# Patient Record
Sex: Female | Born: 1980 | Race: Black or African American | Hispanic: No | Marital: Single | Smoking: Former smoker
Health system: Southern US, Community
[De-identification: ages and names within clinical notes are randomized; demographics above are authoritative.]

---

## 2008-05-25 ENCOUNTER — Emergency Department (HOSPITAL_BASED_OUTPATIENT_CLINIC_OR_DEPARTMENT_OTHER): Admission: EM | Admit: 2008-05-25 | Discharge: 2008-05-25 | Payer: Self-pay | Admitting: Emergency Medicine

## 2009-01-05 ENCOUNTER — Emergency Department (HOSPITAL_COMMUNITY): Admission: EM | Admit: 2009-01-05 | Discharge: 2009-01-05 | Payer: Self-pay | Admitting: Emergency Medicine

## 2009-01-27 ENCOUNTER — Emergency Department (HOSPITAL_COMMUNITY): Admission: EM | Admit: 2009-01-27 | Discharge: 2009-01-28 | Payer: Self-pay | Admitting: Emergency Medicine

## 2009-04-25 ENCOUNTER — Emergency Department (HOSPITAL_COMMUNITY): Admission: EM | Admit: 2009-04-25 | Discharge: 2009-04-25 | Payer: Self-pay | Admitting: Emergency Medicine

## 2009-10-21 ENCOUNTER — Emergency Department (HOSPITAL_COMMUNITY): Admission: EM | Admit: 2009-10-21 | Discharge: 2009-10-21 | Payer: Self-pay | Admitting: Emergency Medicine

## 2010-03-17 ENCOUNTER — Emergency Department (HOSPITAL_BASED_OUTPATIENT_CLINIC_OR_DEPARTMENT_OTHER): Admission: EM | Admit: 2010-03-17 | Discharge: 2010-03-17 | Payer: Self-pay | Admitting: Emergency Medicine

## 2010-04-12 ENCOUNTER — Ambulatory Visit: Payer: Self-pay | Admitting: Diagnostic Radiology

## 2010-05-23 ENCOUNTER — Emergency Department (HOSPITAL_COMMUNITY): Admission: EM | Admit: 2010-05-23 | Discharge: 2010-05-23 | Payer: Self-pay | Admitting: Emergency Medicine

## 2010-06-09 ENCOUNTER — Emergency Department (HOSPITAL_BASED_OUTPATIENT_CLINIC_OR_DEPARTMENT_OTHER): Admission: EM | Admit: 2010-06-09 | Discharge: 2010-04-12 | Payer: Self-pay | Admitting: Emergency Medicine

## 2010-06-09 ENCOUNTER — Emergency Department (HOSPITAL_COMMUNITY): Admission: EM | Admit: 2010-06-09 | Discharge: 2010-01-08 | Payer: Self-pay | Admitting: Emergency Medicine

## 2010-09-15 LAB — D-DIMER, QUANTITATIVE: D-Dimer, Quant: 0.37 ug/mL-FEU (ref 0.00–0.48)

## 2010-09-18 LAB — POCT I-STAT, CHEM 8
BUN: 16 mg/dL (ref 6–23)
Calcium, Ion: 1 mmol/L — ABNORMAL LOW (ref 1.12–1.32)
Chloride: 114 mEq/L — ABNORMAL HIGH (ref 96–112)
Creatinine, Ser: 0.8 mg/dL (ref 0.4–1.2)
Glucose, Bld: 96 mg/dL (ref 70–99)
HCT: 37 % (ref 36.0–46.0)
Hemoglobin: 12.6 g/dL (ref 12.0–15.0)
Potassium: 4.2 mEq/L (ref 3.5–5.1)
Sodium: 140 mEq/L (ref 135–145)
TCO2: 19 mmol/L (ref 0–100)

## 2010-09-18 LAB — POCT PREGNANCY, URINE: Preg Test, Ur: NEGATIVE

## 2010-10-09 LAB — URINALYSIS, ROUTINE W REFLEX MICROSCOPIC
Bilirubin Urine: NEGATIVE
Glucose, UA: NEGATIVE mg/dL
Hgb urine dipstick: NEGATIVE
Ketones, ur: NEGATIVE mg/dL
Nitrite: NEGATIVE
Protein, ur: NEGATIVE mg/dL
Specific Gravity, Urine: 1.031 — ABNORMAL HIGH (ref 1.005–1.030)
Urobilinogen, UA: 0.2 mg/dL (ref 0.0–1.0)
pH: 6 (ref 5.0–8.0)

## 2010-10-09 LAB — POCT I-STAT, CHEM 8
BUN: 6 mg/dL (ref 6–23)
Calcium, Ion: 1.18 mmol/L (ref 1.12–1.32)
Chloride: 109 mEq/L (ref 96–112)
Creatinine, Ser: 0.8 mg/dL (ref 0.4–1.2)
Glucose, Bld: 72 mg/dL (ref 70–99)
HCT: 37 % (ref 36.0–46.0)
Hemoglobin: 12.6 g/dL (ref 12.0–15.0)
Potassium: 3.7 mEq/L (ref 3.5–5.1)
Sodium: 142 mEq/L (ref 135–145)
TCO2: 21 mmol/L (ref 0–100)

## 2010-10-09 LAB — PREGNANCY, URINE: Preg Test, Ur: NEGATIVE

## 2010-10-09 LAB — POCT CARDIAC MARKERS
CKMB, poc: <1 ng/mL — ABNORMAL LOW (ref 1.0–8.0)
Myoglobin, poc: 26.8 ng/mL (ref 12–200)
Troponin i, poc: <0.05 ng/mL (ref 0.00–0.09)

## 2010-10-09 LAB — D-DIMER, QUANTITATIVE: D-Dimer, Quant: 0.28 ug/mL-FEU (ref 0.00–0.48)

## 2010-10-11 ENCOUNTER — Emergency Department (INDEPENDENT_AMBULATORY_CARE_PROVIDER_SITE_OTHER): Payer: Self-pay

## 2010-10-11 ENCOUNTER — Emergency Department (HOSPITAL_BASED_OUTPATIENT_CLINIC_OR_DEPARTMENT_OTHER)
Admission: EM | Admit: 2010-10-11 | Discharge: 2010-10-12 | Disposition: A | Discharge status: Home / Self Care | Payer: Self-pay | Attending: Emergency Medicine | Admitting: Emergency Medicine

## 2010-10-11 DIAGNOSIS — R079 Chest pain, unspecified: Secondary | ICD-10-CM | POA: Insufficient documentation

## 2010-10-11 DIAGNOSIS — R071 Chest pain on breathing: Secondary | ICD-10-CM | POA: Insufficient documentation

## 2010-10-11 DIAGNOSIS — F17200 Nicotine dependence, unspecified, uncomplicated: Secondary | ICD-10-CM

## 2010-10-12 LAB — D-DIMER, QUANTITATIVE: D-Dimer, Quant: 0.3 ug/mL-FEU (ref 0.00–0.48)

## 2011-12-06 ENCOUNTER — Encounter (HOSPITAL_COMMUNITY): Payer: Self-pay

## 2011-12-06 ENCOUNTER — Emergency Department (HOSPITAL_COMMUNITY)
Admission: EM | Admit: 2011-12-06 | Discharge: 2011-12-06 | Disposition: A | Discharge status: Home / Self Care | Payer: No Typology Code available for payment source | Attending: Emergency Medicine | Admitting: Emergency Medicine

## 2011-12-06 DIAGNOSIS — M25551 Pain in right hip: Secondary | ICD-10-CM

## 2011-12-06 DIAGNOSIS — M545 Low back pain, unspecified: Secondary | ICD-10-CM | POA: Insufficient documentation

## 2011-12-06 DIAGNOSIS — M25559 Pain in unspecified hip: Secondary | ICD-10-CM | POA: Insufficient documentation

## 2011-12-06 DIAGNOSIS — F17200 Nicotine dependence, unspecified, uncomplicated: Secondary | ICD-10-CM | POA: Insufficient documentation

## 2011-12-06 MED ORDER — DIAZEPAM 5 MG PO TABS: 5.0000 mg | ORAL_TABLET | Freq: Three times a day (TID) | ORAL | Status: AC | PRN

## 2011-12-06 MED ORDER — HYDROCODONE-ACETAMINOPHEN 5-325 MG PO TABS: 1.0000 | ORAL_TABLET | ORAL | Status: AC | PRN

## 2011-12-06 MED ORDER — HYDROCODONE-ACETAMINOPHEN 5-325 MG PO TABS
1.0000 | ORAL_TABLET | Freq: Once | ORAL | Status: AC
  Administered 2011-12-06: 1 via ORAL
  Filled 2011-12-06: qty 1

## 2011-12-06 NOTE — ED Provider Notes (Signed)
Medical screening examination/treatment/procedure(s) were performed by non-physician practitioner and as supervising physician I was immediately available for consultation/collaboration.  Eddith Mentor L Alston Berrie, MD 12/06/11 2332 
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
## 2012-01-14 ENCOUNTER — Emergency Department (HOSPITAL_BASED_OUTPATIENT_CLINIC_OR_DEPARTMENT_OTHER): Payer: Self-pay

## 2012-01-14 ENCOUNTER — Encounter (HOSPITAL_BASED_OUTPATIENT_CLINIC_OR_DEPARTMENT_OTHER): Payer: Self-pay | Admitting: *Deleted

## 2012-01-14 ENCOUNTER — Emergency Department (HOSPITAL_BASED_OUTPATIENT_CLINIC_OR_DEPARTMENT_OTHER)
Admission: EM | Admit: 2012-01-14 | Discharge: 2012-01-14 | Disposition: A | Discharge status: Home / Self Care | Payer: Self-pay | Attending: Emergency Medicine | Admitting: Emergency Medicine

## 2012-01-14 DIAGNOSIS — R071 Chest pain on breathing: Secondary | ICD-10-CM | POA: Insufficient documentation

## 2012-01-14 DIAGNOSIS — F17200 Nicotine dependence, unspecified, uncomplicated: Secondary | ICD-10-CM | POA: Insufficient documentation

## 2012-01-14 DIAGNOSIS — R0789 Other chest pain: Secondary | ICD-10-CM

## 2012-01-14 LAB — URINALYSIS, ROUTINE W REFLEX MICROSCOPIC
Bilirubin Urine: NEGATIVE
Ketones, ur: 40 mg/dL — AB
Nitrite: NEGATIVE
Urobilinogen, UA: 1 mg/dL (ref 0.0–1.0)
pH: 5.5 (ref 5.0–8.0)

## 2012-01-14 LAB — BASIC METABOLIC PANEL
Calcium: 9.8 mg/dL (ref 8.4–10.5)
GFR calc Af Amer: >90 mL/min (ref >90)
GFR calc non Af Amer: 85 mL/min — ABNORMAL LOW (ref >90)
Sodium: 139 mEq/L (ref 135–145)

## 2012-01-14 LAB — CBC
MCH: 29.5 pg (ref 26.0–34.0)
MCHC: 34.4 g/dL (ref 30.0–36.0)
Platelets: 220 10*3/uL (ref 150–400)
RDW: 11.5 % (ref 11.5–15.5)

## 2012-01-14 LAB — TROPONIN I: Troponin I: <0.3 ng/mL (ref <0.30)

## 2012-01-14 LAB — PREGNANCY, URINE: Preg Test, Ur: NEGATIVE

## 2012-01-14 MED ORDER — HYDROCODONE-ACETAMINOPHEN 5-325 MG PO TABS: 2.0000 | ORAL_TABLET | ORAL | Status: AC | PRN

## 2012-01-14 MED ORDER — IBUPROFEN 800 MG PO TABS: 800.0000 mg | ORAL_TABLET | Freq: Three times a day (TID) | ORAL | Status: AC

## 2012-01-14 NOTE — ED Notes (Signed)
Pt presents to ED today with left sided chest pain that pt states is non-radiating in nature.  Pt reports pain at worst is 10/10 and has SHOB and "sweats" with pain

## 2012-01-14 NOTE — ED Provider Notes (Signed)
Medical screening examination/treatment/procedure(s) were performed by non-physician practitioner and as supervising physician I was immediately available for consultation/collaboration.  Rochelle Nephew, MD 01/14/12 2336 

## 2012-01-14 NOTE — ED Provider Notes (Signed)
History     CSN: [id number]  Arrival date & time 01/14/12  1147   First MD Initiated Contact with Patient 01/14/12 1214      Chief Complaint  Patient presents with  . Chest Pain    (Consider location/radiation/quality/duration/timing/severity/associated sxs/prior treatment) Patient is a 31 y.o. female presenting with chest pain. The history is provided by the patient. No language interpreter was used.  Chest Pain The chest pain began 1 - 2 weeks ago. Duration of episode(s) is 2 weeks. Chest pain occurs constantly. The chest pain is unchanged. The pain is associated with breathing. At its most intense, the pain is at 10/10. The pain is currently at 6/10. The quality of the pain is described as stabbing and sharp. The pain does not radiate. Chest pain is worsened by certain positions and deep breathing. She tried nothing for the symptoms. Risk factors include no known risk factors.  Pertinent negatives for past medical history include no CAD, no diabetes and no hypertension. Procedure history comments: none.   Pt started on depo injection 1 week ago.   Pt reports she had pain before depo/  History reviewed. No pertinent past medical history.  History reviewed. No pertinent past surgical history.  Family History  Problem Relation Age of Onset  . Asthma Mother   . Rheum arthritis Mother   . Hypertension Mother   . Heart failure Mother     History  Substance Use Topics  . Smoking status: Current Everyday Smoker  . Smokeless tobacco: Never Used  . Alcohol Use: No    OB History    Grav Para Term Preterm Abortions TAB SAB Ect Mult Living                  Review of Systems  Cardiovascular: Positive for chest pain.  All other systems reviewed and are negative.    Allergies  Review of patient's allergies indicates no known allergies.  Home Medications   Current Outpatient Rx  Name Route Sig Dispense Refill  . OMEGA-3 FATTY ACIDS 1000 MG PO CAPS Oral Take 2 g by mouth  daily.    . IBUPROFEN 200 MG PO TABS Oral Take 200 mg by mouth every 6 (six) hours as needed. Pain    . MEDROXYPROGESTERONE ACETATE 150 MG/ML IM SUSP Intramuscular Inject 150 mg into the muscle every 3 (three) months.    . ADULT MULTIVITAMIN W/MINERALS CH Oral Take 1 tablet by mouth daily.      BP 142/95  Pulse 70  Temp 98 F (36.7 C) (Oral)  Ht 5\' 7"  (1.702 m)  Wt 156 lb (70.761 kg)  BMI 24.43 kg/m2  SpO2 100%  Physical Exam  Nursing note and vitals reviewed. Constitutional: She appears well-developed and well-nourished.  HENT:  Head: Normocephalic and atraumatic.  Eyes: Conjunctivae and EOM are normal. Pupils are equal, round, and reactive to light.  Neck: Normal range of motion.  Cardiovascular: Normal rate and regular rhythm.   Pulmonary/Chest: Effort normal and breath sounds normal.  Abdominal: Soft.  Musculoskeletal: She exhibits tenderness.       Tender left chest,    Neurological: She is alert.  Skin: Skin is warm.    ED Course  Procedures (including critical care time)  Labs Reviewed  BASIC METABOLIC PANEL - Abnormal; Notable for the following:    GFR calc non Af Amer 85 (*)     All other components within normal limits  URINALYSIS, ROUTINE W REFLEX MICROSCOPIC - Abnormal; Notable for the  following:    Color, Urine AMBER (*)  BIOCHEMICALS MAY BE AFFECTED BY COLOR   APPearance CLOUDY (*)     Ketones, ur 40 (*)     All other components within normal limits  CBC  TROPONIN I  PREGNANCY, URINE  LIPASE, BLOOD  D-DIMER, QUANTITATIVE   Dg Chest 2 View  01/14/2012  *RADIOLOGY REPORT*  Clinical Data: Pleuritic chest pain.  CHEST - 2 VIEW  Comparison:  10/12/2010  Findings:  The heart size and mediastinal contours are within normal limits.  Both lungs are clear.  The visualized skeletal structures are unremarkable.  IMPRESSION: No active cardiopulmonary disease.  Original Report Authenticated By: Danae Orleans, M.D.     Date: 01/14/2012  Rate: 68  Rhythm: normal  sinus rhythm  QRS Axis: normal  Intervals: normal  ST/T Wave abnormalities: normal  Conduction Disutrbances:none  Narrative Interpretation:   Old EKG Reviewed: unchanged  No diagnosis found.    MDM  Labs ddimer negative,  Pt's pain seems more muscular/pleuritic.   Pt given rx for hydrocodone and ibuprofen        Lonia Skinner [place], [place] 01/14/12 1418

## 2012-09-09 ENCOUNTER — Encounter (HOSPITAL_BASED_OUTPATIENT_CLINIC_OR_DEPARTMENT_OTHER): Payer: Self-pay

## 2012-09-09 ENCOUNTER — Emergency Department (HOSPITAL_BASED_OUTPATIENT_CLINIC_OR_DEPARTMENT_OTHER): Payer: Self-pay

## 2012-09-09 DIAGNOSIS — F17200 Nicotine dependence, unspecified, uncomplicated: Secondary | ICD-10-CM | POA: Insufficient documentation

## 2012-09-09 DIAGNOSIS — IMO0002 Reserved for concepts with insufficient information to code with codable children: Secondary | ICD-10-CM | POA: Insufficient documentation

## 2012-09-09 DIAGNOSIS — Z79899 Other long term (current) drug therapy: Secondary | ICD-10-CM | POA: Insufficient documentation

## 2012-09-09 DIAGNOSIS — R0982 Postnasal drip: Secondary | ICD-10-CM | POA: Insufficient documentation

## 2012-09-09 DIAGNOSIS — R490 Dysphonia: Secondary | ICD-10-CM | POA: Insufficient documentation

## 2012-09-09 DIAGNOSIS — J3489 Other specified disorders of nose and nasal sinuses: Secondary | ICD-10-CM | POA: Insufficient documentation

## 2012-09-09 LAB — PREGNANCY, URINE: Preg Test, Ur: NEGATIVE

## 2012-09-09 NOTE — ED Provider Notes (Signed)
History  This chart was scribed for April Smitty Cords, MD by Ardeen Jourdain, ED Scribe. This patient was seen in room MH10/MH10 and the patient's care was started at 0015.  CSN: [id number]  Arrival date & time 09/09/12  2146   None     Chief Complaint  Patient presents with  . Cough     Patient is a 32 y.o. female presenting with cough. The history is provided by the patient. No language interpreter was used.  Cough Cough characteristics:  Non-productive Severity:  Mild Onset quality:  Gradual Duration:  3 days Timing:  Intermittent Progression:  Worsening Chronicity:  New Smoker: no   Context: not sick contacts, not weather changes and not with activity   Relieved by:  Nothing Worsened by:  Nothing tried Ineffective treatments:  Cough suppressants and decongestant Associated symptoms: sinus congestion   Associated symptoms: no chest pain, no chills, no diaphoresis, no fever, no headaches, no rash, no rhinorrhea, no shortness of breath and no sore throat   Risk factors: no recent infection and no recent travel     Rose Lowe is a 32 y.o. female who presents to the Emergency Department complaining of cough and hoarse voice that began 3 days ago. She reports using OTC cold medication with no relief.    History reviewed. No pertinent past medical history.  History reviewed. No pertinent past surgical history.  Family History  Problem Relation Age of Onset  . Asthma Mother   . Rheum arthritis Mother   . Hypertension Mother   . Heart failure Mother     History  Substance Use Topics  . Smoking status: Current Every Day Smoker  . Smokeless tobacco: Never Used  . Alcohol Use: No   No OB history available.   Review of Systems  Constitutional: Negative for fever, chills and diaphoresis.  HENT: Negative for sore throat, rhinorrhea, neck pain and neck stiffness.   Respiratory: Positive for cough. Negative for shortness of breath.   Cardiovascular: Negative  for chest pain, palpitations and leg swelling.  Skin: Negative for rash.  Neurological: Negative for headaches.  All other systems reviewed and are negative.    Allergies  Review of patient's allergies indicates no known allergies.  Home Medications   Current Outpatient Rx  Name  Route  Sig  Dispense  Refill  . fish oil-omega-3 fatty acids 1000 MG capsule   Oral   Take 2 g by mouth daily.         . fluticasone (FLONASE) 50 MCG/ACT nasal spray   Nasal   Place 2 sprays into the nose daily.   16 g   0   . ibuprofen (ADVIL,MOTRIN) 200 MG tablet   Oral   Take 200 mg by mouth every 6 (six) hours as needed. Pain         . medroxyPROGESTERone (DEPO-PROVERA) 150 MG/ML injection   Intramuscular   Inject 150 mg into the muscle every 3 (three) months.         . Multiple Vitamin (MULITIVITAMIN WITH MINERALS) TABS   Oral   Take 1 tablet by mouth daily.           Triage Vitals: BP 127/82  Pulse 77  Temp(Src) 98.9 F (37.2 C) (Oral)  Resp 16  Ht 5\' 7"  (1.702 m)  Wt 145 lb (65.772 kg)  BMI 22.71 kg/m2  SpO2 96%  Physical Exam  Nursing note and vitals reviewed. Constitutional: She is oriented to person, place, and time. She appears  well-developed and well-nourished. No distress.  HENT:  Head: Normocephalic and atraumatic.  Mouth/Throat: Oropharynx is clear and moist. No oropharyngeal exudate.  Clear colorless drainage down the throat, TMs normal bilaterally   Eyes: EOM are normal. Pupils are equal, round, and reactive to light.  Neck: Normal range of motion. Neck supple. No tracheal deviation present.  No lymphadenopathy   Cardiovascular: Normal rate, regular rhythm and normal heart sounds.  Exam reveals no gallop and no friction rub.   No murmur heard. Pulmonary/Chest: Effort normal and breath sounds normal. No respiratory distress. She has no wheezes. She has no rales. She exhibits no tenderness.  Abdominal: Soft. Bowel sounds are normal. She exhibits no  distension and no mass. There is no tenderness. There is no rebound and no guarding.  Musculoskeletal: Normal range of motion. She exhibits no edema.  Lymphadenopathy:    She has no cervical adenopathy.  Neurological: She is alert and oriented to person, place, and time.  Skin: Skin is warm and dry.  Psychiatric: She has a normal mood and affect. Her behavior is normal.    ED Course  Procedures (including critical care time)  DIAGNOSTIC STUDIES: Oxygen Saturation is 96% on room air, normal by my interpretation.    COORDINATION OF CARE:  12:25 AM: Discussed treatment plan which includes nasal spray with pt at bedside and pt agreed to plan.     Labs Reviewed  PREGNANCY, URINE   Dg Chest 2 View  09/10/2012  *RADIOLOGY REPORT*  Clinical Data: Cough, congestion, sore throat  CHEST - 2 VIEW  Comparison: 01/14/2012  Findings: Normal heart size, mediastinal contours, and pulmonary vascularity. Lungs clear. No pleural effusion or pneumothorax. Bones unremarkable.  IMPRESSION: No acute abnormalities.   Original Report Authenticated By: Ulyses Southward, M.D.      1. Post-nasal drip       MDM  Symptoms consistent with post nasal drip.  Will treat for same     I personally performed the services described in this documentation, which was scribed in my presence. The recorded information has been reviewed and is accurate.    Jasmine Awe, MD 09/10/12 [phone]

## 2012-09-09 NOTE — ED Notes (Signed)
Cough and hoarse x 3 days-NAD

## 2012-09-10 ENCOUNTER — Emergency Department (HOSPITAL_BASED_OUTPATIENT_CLINIC_OR_DEPARTMENT_OTHER)
Admission: EM | Admit: 2012-09-10 | Discharge: 2012-09-10 | Disposition: A | Discharge status: Home / Self Care | Payer: Self-pay | Attending: Emergency Medicine | Admitting: Emergency Medicine

## 2012-09-10 DIAGNOSIS — R0982 Postnasal drip: Secondary | ICD-10-CM

## 2012-09-10 MED ORDER — FLUTICASONE PROPIONATE 50 MCG/ACT NA SUSP: 2.0000 | Freq: Every day | NASAL | Status: DC

## 2012-10-01 ENCOUNTER — Emergency Department (HOSPITAL_COMMUNITY)
Admission: EM | Admit: 2012-10-01 | Discharge: 2012-10-01 | Disposition: A | Discharge status: Home / Self Care | Payer: Self-pay | Attending: Emergency Medicine | Admitting: Emergency Medicine

## 2012-10-01 ENCOUNTER — Encounter (HOSPITAL_COMMUNITY): Payer: Self-pay

## 2012-10-01 DIAGNOSIS — F17200 Nicotine dependence, unspecified, uncomplicated: Secondary | ICD-10-CM | POA: Insufficient documentation

## 2012-10-01 DIAGNOSIS — K0889 Other specified disorders of teeth and supporting structures: Secondary | ICD-10-CM

## 2012-10-01 DIAGNOSIS — IMO0002 Reserved for concepts with insufficient information to code with codable children: Secondary | ICD-10-CM | POA: Insufficient documentation

## 2012-10-01 DIAGNOSIS — R220 Localized swelling, mass and lump, head: Secondary | ICD-10-CM | POA: Insufficient documentation

## 2012-10-01 DIAGNOSIS — R509 Fever, unspecified: Secondary | ICD-10-CM | POA: Insufficient documentation

## 2012-10-01 DIAGNOSIS — K029 Dental caries, unspecified: Secondary | ICD-10-CM | POA: Insufficient documentation

## 2012-10-01 DIAGNOSIS — Z79899 Other long term (current) drug therapy: Secondary | ICD-10-CM | POA: Insufficient documentation

## 2012-10-01 DIAGNOSIS — K089 Disorder of teeth and supporting structures, unspecified: Secondary | ICD-10-CM | POA: Insufficient documentation

## 2012-10-01 MED ORDER — PENICILLIN V POTASSIUM 500 MG PO TABS: 500.0000 mg | ORAL_TABLET | Freq: Four times a day (QID) | ORAL | Status: AC

## 2012-10-01 MED ORDER — HYDROCODONE-ACETAMINOPHEN 5-325 MG PO TABS: 1.0000 | ORAL_TABLET | ORAL | Status: DC | PRN

## 2012-10-01 NOTE — ED Provider Notes (Signed)
History    This chart was scribed for non-physician practitioner working with Toy Baker, MD by ED Scribe, Burman Nieves. This patient was seen in room WTR6/WTR6 and the patient's care was started at 8:42 PM.  CSN: [id number]  Arrival date & time 10/01/12  1914   First MD Initiated Contact with Patient 10/01/12 2042      Chief Complaint  Patient presents with  . Dental Pain    (Consider location/radiation/quality/duration/timing/severity/associated sxs/prior treatment) Patient is a 32 y.o. female presenting with tooth pain. The history is provided by the patient. No language interpreter was used.  Dental Pain  Rose Lowe is a 32 y.o. female who presents to the Emergency Department complaining of moderate constant dental pain of left lower jaw onset 3 days ago. Pain exacerbated by chewing.  Pt states that he has taken OTC medications (ibuprofen) without relief.  Pt had a low grade fever today (99). Prior episodes of dental abscesses requiring surgical intervention. Pt denies chills, cough, nausea, vomiting, diarrhea, SOB, weakness, and any other associated symptoms. No trismus.  Pt is a current tobacco smoker that smokes daily.  Dental appointment scheduled October 29, 2012.    History reviewed. No pertinent past medical history.  History reviewed. No pertinent past surgical history.  Family History  Problem Relation Age of Onset  . Asthma Mother   . Rheum arthritis Mother   . Hypertension Mother   . Heart failure Mother     History  Substance Use Topics  . Smoking status: Current Every Day Smoker  . Smokeless tobacco: Never Used  . Alcohol Use: No    OB History   Grav Para Term Preterm Abortions TAB SAB Ect Mult Living                  Review of Systems  HENT: Positive for dental problem.   All other systems reviewed and are negative.    Allergies  Review of patient's allergies indicates no known allergies.  Home Medications   Current Outpatient Rx  Name   Route  Sig  Dispense  Refill  . fish oil-omega-3 fatty acids 1000 MG capsule   Oral   Take 2 g by mouth daily.         . fluticasone (FLONASE) 50 MCG/ACT nasal spray   Nasal   Place 2 sprays into the nose daily.   16 g   0   . ibuprofen (ADVIL,MOTRIN) 200 MG tablet   Oral   Take 200 mg by mouth every 6 (six) hours as needed. Pain         . medroxyPROGESTERone (DEPO-PROVERA) 150 MG/ML injection   Intramuscular   Inject 150 mg into the muscle every 3 (three) months.         . Multiple Vitamin (MULITIVITAMIN WITH MINERALS) TABS   Oral   Take 1 tablet by mouth daily.           BP 123/80  Pulse 105  Temp(Src) 98.3 F (36.8 C) (Oral)  Resp 20  SpO2 99%  Physical Exam  Nursing note and vitals reviewed. Constitutional: She is oriented to person, place, and time. She appears well-developed and well-nourished. No distress.  HENT:  Head: Normocephalic and atraumatic. No trismus in the jaw.  Mouth/Throat: Uvula is midline, oropharynx is clear and moist and mucous membranes are normal. No oral lesions. Abnormal dentition. Dental caries present. No edematous. No oropharyngeal exudate, posterior oropharyngeal edema, posterior oropharyngeal erythema or tonsillar abscesses.    Eyes:  EOM are normal. Pupils are equal, round, and reactive to light.  Neck: Normal range of motion. Neck supple. No tracheal deviation present.  Cardiovascular: Normal rate.   Pulmonary/Chest: Effort normal. No respiratory distress.  Musculoskeletal: Normal range of motion.  Lymphadenopathy:    She has no cervical adenopathy.  Neurological: She is alert and oriented to person, place, and time.  Skin: Skin is warm and dry.  Psychiatric: She has a normal mood and affect. Her behavior is normal.    ED Course  Procedures (including critical care time) DIAGNOSTIC STUDIES: Oxygen Saturation is 99% on room air, normal by my interpretation.    COORDINATION OF CARE: 8:59 PM Discussed ED treatment with  pt and pt agrees.     Labs Reviewed - No data to display No results found.   1. Pain, dental       MDM   Dental pain x 3 days.  Teeth largely in poor dentition.  Large amount of swelling and erythema surrounding L lower molar- probable early dental abscess.  Rx pen VK, vicodin.  Will need earlier FU with dentist than her 4/29 appt.  Referral to  Dr. Leanord Asal.  Return precautions advised.  I personally performed the services described in this documentation, which was scribed in my presence. The recorded information has been reviewed and is accurate.          Garlon Hatchet, PA-C 10/02/12 1227

## 2012-10-01 NOTE — ED Notes (Signed)
Pt ambulatory to exam room with steady gait.  

## 2012-10-01 NOTE — ED Notes (Signed)
Pt with dental pain x 3 days.  Taking otc meds for pain with no relief.  Pt has appt with [facility] works on April 29th to get assistance.

## 2012-10-02 NOTE — ED Provider Notes (Signed)
Medical screening examination/treatment/procedure(s) were performed by non-physician practitioner and as supervising physician I was immediately available for consultation/collaboration.  Riven Beebe T Shonnie Poudrier, MD 10/02/12 1706 

## 2013-09-26 ENCOUNTER — Encounter (HOSPITAL_COMMUNITY): Payer: Self-pay | Admitting: Emergency Medicine

## 2013-09-26 ENCOUNTER — Emergency Department (HOSPITAL_COMMUNITY)
Admission: EM | Admit: 2013-09-26 | Discharge: 2013-09-26 | Discharge status: Against Medical Advice | Payer: No Typology Code available for payment source | Attending: Emergency Medicine | Admitting: Emergency Medicine

## 2013-09-26 DIAGNOSIS — F17200 Nicotine dependence, unspecified, uncomplicated: Secondary | ICD-10-CM | POA: Insufficient documentation

## 2013-09-26 DIAGNOSIS — L509 Urticaria, unspecified: Secondary | ICD-10-CM | POA: Insufficient documentation

## 2013-09-26 NOTE — ED Notes (Signed)
Called x 1 without response from lobby

## 2013-09-26 NOTE — ED Notes (Signed)
Called x 2 without response noted from lobby

## 2013-09-26 NOTE — ED Notes (Signed)
Pt states she got a flu shot and a tetanus injection for school last week  Pt states on Saturday she started having little bumps around the area of her flu shots and since has developed hives all over her body  Pt states she is itching all over

## 2013-09-29 ENCOUNTER — Emergency Department (HOSPITAL_BASED_OUTPATIENT_CLINIC_OR_DEPARTMENT_OTHER)
Admission: EM | Admit: 2013-09-29 | Discharge: 2013-09-29 | Disposition: A | Discharge status: Home / Self Care | Payer: No Typology Code available for payment source | Attending: Emergency Medicine | Admitting: Emergency Medicine

## 2013-09-29 ENCOUNTER — Encounter (HOSPITAL_BASED_OUTPATIENT_CLINIC_OR_DEPARTMENT_OTHER): Payer: Self-pay | Admitting: Emergency Medicine

## 2013-09-29 DIAGNOSIS — T4995XA Adverse effect of unspecified topical agent, initial encounter: Secondary | ICD-10-CM | POA: Insufficient documentation

## 2013-09-29 DIAGNOSIS — R21 Rash and other nonspecific skin eruption: Secondary | ICD-10-CM | POA: Insufficient documentation

## 2013-09-29 DIAGNOSIS — T7840XA Allergy, unspecified, initial encounter: Secondary | ICD-10-CM

## 2013-09-29 DIAGNOSIS — Z79899 Other long term (current) drug therapy: Secondary | ICD-10-CM | POA: Insufficient documentation

## 2013-09-29 DIAGNOSIS — F17200 Nicotine dependence, unspecified, uncomplicated: Secondary | ICD-10-CM | POA: Insufficient documentation

## 2013-09-29 DIAGNOSIS — IMO0002 Reserved for concepts with insufficient information to code with codable children: Secondary | ICD-10-CM | POA: Insufficient documentation

## 2013-09-29 MED ORDER — HYDROXYZINE HCL 25 MG PO TABS: 25.0000 mg | ORAL_TABLET | Freq: Four times a day (QID) | ORAL | Status: DC

## 2013-09-29 MED ORDER — PREDNISONE 20 MG PO TABS: ORAL_TABLET | ORAL | Status: DC

## 2013-09-29 MED ORDER — PREDNISONE 50 MG PO TABS
60.0000 mg | ORAL_TABLET | Freq: Once | ORAL | Status: AC
  Administered 2013-09-29: 60 mg via ORAL
  Filled 2013-09-29 (×2): qty 1

## 2013-09-29 NOTE — ED Notes (Signed)
Rash over the past week since getting her flu shot and immunizations. No respiratory distress.

## 2013-09-29 NOTE — ED Provider Notes (Signed)
Medical screening examination/treatment/procedure(s) were performed by non-physician practitioner and as supervising physician I was immediately available for consultation/collaboration.   EKG Interpretation None        Izabell Schalk, MD 09/29/13 2357 

## 2013-09-29 NOTE — ED Provider Notes (Signed)
CSN: [id number]     Arrival date & time 09/29/13  1905 History   First MD Initiated Contact with Patient 09/29/13 2059     Chief Complaint  Patient presents with  . Rash     (Consider location/radiation/quality/duration/timing/severity/associated sxs/prior Treatment) HPI Comments: Patient is a 33 year old female who presents to the emergency department complaining of a rash times one week. Patient states one week ago she had her flu vaccine and a PPD placed, PPD was negative, the next day she developed itching around the area where her flu shot was. Over the past week she noticed a rash developing and spreading on bilateral arms, chest, abdomen and back. Denies difficulty swallowing, no respiratory compromise. Denies any new soaps, detergents, lotions or pets. She has not stayed in a different place. No contacts with similar rash.  Patient is a 33 y.o. female presenting with rash. The history is provided by the patient.  Rash   History reviewed. No pertinent past medical history. History reviewed. No pertinent past surgical history. Family History  Problem Relation Age of Onset  . Asthma Mother   . Rheum arthritis Mother   . Hypertension Mother    History  Substance Use Topics  . Smoking status: Current Every Day Smoker  . Smokeless tobacco: Never Used  . Alcohol Use: No   OB History   Grav Para Term Preterm Abortions TAB SAB Ect Mult Living                 Review of Systems  Skin: Positive for rash.  All other systems reviewed and are negative.      Allergies  Review of patient's allergies indicates no known allergies.  Home Medications   Current Outpatient Rx  Name  Route  Sig  Dispense  Refill  . fish oil-omega-3 fatty acids 1000 MG capsule   Oral   Take 2 g by mouth daily.         . fluticasone (FLONASE) 50 MCG/ACT nasal spray   Nasal   Place 2 sprays into the nose daily.   16 g   0   . HYDROcodone-acetaminophen (NORCO/VICODIN) 5-325 MG per tablet    Oral   Take 1 tablet by mouth every 4 (four) hours as needed for pain.   15 tablet   0   . hydrOXYzine (ATARAX/VISTARIL) 25 MG tablet   Oral   Take 1 tablet (25 mg total) by mouth every 6 (six) hours.   12 tablet   0   . ibuprofen (ADVIL,MOTRIN) 200 MG tablet   Oral   Take 200 mg by mouth every 6 (six) hours as needed. Pain         . medroxyPROGESTERone (DEPO-PROVERA) 150 MG/ML injection   Intramuscular   Inject 150 mg into the muscle every 3 (three) months.         . Multiple Vitamin (MULITIVITAMIN WITH MINERALS) TABS   Oral   Take 1 tablet by mouth daily.         . predniSONE (DELTASONE) 20 MG tablet      Take 2 tabs PO x 4 days   8 tablet   0    BP 149/87  Pulse 100  Temp(Src) 98.8 F (37.1 C) (Oral)  Resp 16  Ht 5\' 6"  (1.676 m)  Wt 150 lb (68.04 kg)  BMI 24.22 kg/m2  SpO2 100% Physical Exam  Nursing note and vitals reviewed. Constitutional: She is oriented to person, place, and time. She appears well-developed and well-nourished.  No distress.  HENT:  Head: Normocephalic and atraumatic.  Mouth/Throat: Oropharynx is clear and moist.  Eyes: Conjunctivae are normal.  Neck: Normal range of motion. Neck supple. No tracheal deviation present.  Cardiovascular: Normal rate, regular rhythm and normal heart sounds.   Pulmonary/Chest: Effort normal and breath sounds normal.  Musculoskeletal: Normal range of motion. She exhibits no edema.  Neurological: She is alert and oriented to person, place, and time.  Skin: Skin is warm and dry. She is not diaphoretic.  Scattered raised maculopapular lesions bilateral arms, abdomen and back, no signs of secondary infection.  Psychiatric: She has a normal mood and affect. Her behavior is normal.    ED Course  Procedures (including critical care time) Labs Review Labs Reviewed - No data to display Imaging Review No results found.   EKG Interpretation None      MDM   Final diagnoses:  Allergic reaction  Rash     No respiratory or airway compromise. She appears in no apparent distress. No tachycardia on my exam despite pulse of 100 in triage. 60 mg prednisone given in the ED, short course of prednisone at discharge along with Atarax. Stable for discharge. Return precautions given. Patient states understanding of treatment care plan and is agreeable.   Trevor MaceRobyn M Albert, PA-C 09/29/13 2120

## 2013-09-29 NOTE — Discharge Instructions (Signed)
Take prednisone beginning tomorrow as you were given her first dose in the emergency department today. Use Atarax as needed for itching, no driving or operating heavy machinery while taking this drug as it may cause drowsiness.  Rash A rash is a change in the color or texture of your skin. There are many different types of rashes. You may have other problems that accompany your rash. CAUSES   Infections.  Allergic reactions. This can include allergies to pets or foods.  Certain medicines.  Exposure to certain chemicals, soaps, or cosmetics.  Heat.  Exposure to poisonous plants.  Tumors, both cancerous and noncancerous. SYMPTOMS   Redness.  Scaly skin.  Itchy skin.  Dry or cracked skin.  Bumps.  Blisters.  Pain. DIAGNOSIS  Your caregiver may do a physical exam to determine what type of rash you have. A skin sample (biopsy) may be taken and examined under a microscope. TREATMENT  Treatment depends on the type of rash you have. Your caregiver may prescribe certain medicines. For serious conditions, you may need to see a skin doctor (dermatologist). HOME CARE INSTRUCTIONS   Avoid the substance that caused your rash.  Do not scratch your rash. This can cause infection.  You may take cool baths to help stop itching.  Only take over-the-counter or prescription medicines as directed by your caregiver.  Keep all follow-up appointments as directed by your caregiver. SEEK IMMEDIATE MEDICAL CARE IF:  You have increasing pain, swelling, or redness.  You have a fever.  You have new or severe symptoms.  You have body aches, diarrhea, or vomiting.  Your rash is not better after 3 days. MAKE SURE YOU:  Understand these instructions.  Will watch your condition.  Will get help right away if you are not doing well or get worse. Document Released: 06/09/2002 Document Revised: 09/11/2011 Document Reviewed: 04/03/2011 [facility] CenterExitCare Patient Information 2014 [place]Care, [place].

## 2014-12-17 ENCOUNTER — Emergency Department (HOSPITAL_BASED_OUTPATIENT_CLINIC_OR_DEPARTMENT_OTHER)
Admission: EM | Admit: 2014-12-17 | Discharge: 2014-12-17 | Disposition: A | Discharge status: Home / Self Care | Payer: Self-pay | Attending: Emergency Medicine | Admitting: Emergency Medicine

## 2014-12-17 ENCOUNTER — Encounter (HOSPITAL_BASED_OUTPATIENT_CLINIC_OR_DEPARTMENT_OTHER): Payer: Self-pay | Admitting: *Deleted

## 2014-12-17 ENCOUNTER — Emergency Department (HOSPITAL_BASED_OUTPATIENT_CLINIC_OR_DEPARTMENT_OTHER): Payer: Self-pay

## 2014-12-17 DIAGNOSIS — Z79899 Other long term (current) drug therapy: Secondary | ICD-10-CM | POA: Insufficient documentation

## 2014-12-17 DIAGNOSIS — R110 Nausea: Secondary | ICD-10-CM | POA: Insufficient documentation

## 2014-12-17 DIAGNOSIS — Z7982 Long term (current) use of aspirin: Secondary | ICD-10-CM | POA: Insufficient documentation

## 2014-12-17 DIAGNOSIS — Z7952 Long term (current) use of systemic steroids: Secondary | ICD-10-CM | POA: Insufficient documentation

## 2014-12-17 DIAGNOSIS — R079 Chest pain, unspecified: Secondary | ICD-10-CM | POA: Insufficient documentation

## 2014-12-17 DIAGNOSIS — R42 Dizziness and giddiness: Secondary | ICD-10-CM | POA: Insufficient documentation

## 2014-12-17 DIAGNOSIS — Z720 Tobacco use: Secondary | ICD-10-CM | POA: Insufficient documentation

## 2014-12-17 DIAGNOSIS — R519 Headache, unspecified: Secondary | ICD-10-CM

## 2014-12-17 DIAGNOSIS — Z793 Long term (current) use of hormonal contraceptives: Secondary | ICD-10-CM | POA: Insufficient documentation

## 2014-12-17 DIAGNOSIS — R51 Headache: Secondary | ICD-10-CM | POA: Insufficient documentation

## 2014-12-17 DIAGNOSIS — R197 Diarrhea, unspecified: Secondary | ICD-10-CM | POA: Insufficient documentation

## 2014-12-17 MED ORDER — OXYCODONE-ACETAMINOPHEN 5-325 MG PO TABS
2.0000 | ORAL_TABLET | Freq: Once | ORAL | Status: AC
  Administered 2014-12-17: 2 via ORAL
  Filled 2014-12-17: qty 2

## 2014-12-17 NOTE — ED Notes (Addendum)
C/o HA, no relief with "xanax, ibuprofen, goodies, benadryl". Onset 5d ago. Also reports "a little chest pain, minor, had to sit down for second". Pt adds when asked some dizziness and a little diarrhea maybe from what I ate". (denies: fever, sob, nv). Alert, NAD, calm, interactive, resps e/u, no dyspnea noted. Denies CP at this time.

## 2014-12-17 NOTE — ED Provider Notes (Signed)
CSN: [id number]     Arrival date & time 12/17/14  2052 History  This chart was scribed for Margarita Grizzle, MD by Octavia Heir, ED Scribe. This patient was seen in room MH09/MH09 and the patient's care was started at 9:45 PM.    Chief Complaint  Patient presents with  . Headache      Patient is a 34 y.o. female presenting with headaches. The history is provided by the patient. No language interpreter was used.  Headache Pain location:  R parietal and R temporal Onset quality:  Gradual Timing:  Constant Progression:  Worsening Chronicity:  Recurrent Similar to prior headaches: no   Relieved by:  Nothing Worsened by:  Nothing Ineffective treatments:  Aspirin and acetaminophen Associated symptoms: dizziness     HPI Comments: Rose Lowe is a 34 y.o. female who presents to the Emergency Department complaining of a constant, gradual worsening right sided headache onset 5 days ago. Pt notes she does not have any prior hx of having headaches.She notes having minor chest pain and dizziness which were alleviated while sitting down.  She notes trying multiple medications and treatments to alleviate the pain with no relief.   History reviewed. No pertinent past medical history. History reviewed. No pertinent past surgical history. Family History  Problem Relation Age of Onset  . Asthma Mother   . Rheum arthritis Mother   . Hypertension Mother    History  Substance Use Topics  . Smoking status: Current Some Day Smoker  . Smokeless tobacco: Never Used  . Alcohol Use: Yes   OB History    No data available     Review of Systems  Cardiovascular: Positive for chest pain.  Neurological: Positive for dizziness and headaches.  All other systems reviewed and are negative.     Allergies  Review of patient's allergies indicates no known allergies.  Home Medications   Prior to Admission medications   Medication Sig Start Date End Date Taking? Authorizing Provider  fish  oil-omega-3 fatty acids 1000 MG capsule Take 2 g by mouth daily.    Historical Provider, MD  fluticasone (FLONASE) 50 MCG/ACT nasal spray Place 2 sprays into the nose daily. 09/10/12   April Palumbo, MD  HYDROcodone-acetaminophen (NORCO/VICODIN) 5-325 MG per tablet Take 1 tablet by mouth every 4 (four) hours as needed for pain. 10/01/12   Garlon Hatchet, PA-C  hydrOXYzine (ATARAX/VISTARIL) 25 MG tablet Take 1 tablet (25 mg total) by mouth every 6 (six) hours. 09/29/13   Robyn M Hess, PA-C  ibuprofen (ADVIL,MOTRIN) 200 MG tablet Take 200 mg by mouth every 6 (six) hours as needed. Pain    Historical Provider, MD  medroxyPROGESTERone (DEPO-PROVERA) 150 MG/ML injection Inject 150 mg into the muscle every 3 (three) months.    Historical Provider, MD  Multiple Vitamin (MULITIVITAMIN WITH MINERALS) TABS Take 1 tablet by mouth daily.    Historical Provider, MD  predniSONE (DELTASONE) 20 MG tablet Take 2 tabs PO x 4 days 09/29/13   Kathrynn Speed, PA-C   Triage vitals: BP 163/91 mmHg  Pulse 87  Temp(Src) 99.2 F (37.3 C) (Oral)  Resp 18  Ht 5\' 7"  (1.702 m)  Wt 153 lb (69.4 kg)  BMI 23.96 kg/m2  SpO2 100%  LMP 12/02/2014 Physical Exam  Constitutional: She is oriented to person, place, and time. She appears well-developed and well-nourished.  HENT:  Head: Normocephalic and atraumatic.  Right Ear: External ear normal.  Left Ear: External ear normal.  Nose: Nose normal.  Mouth/Throat: Oropharynx is clear and moist.  Eyes: Conjunctivae and EOM are normal. Pupils are equal, round, and reactive to light.  Neck: Normal range of motion. Neck supple. No JVD present. No tracheal deviation present. No thyromegaly present.  Cardiovascular: Normal rate, regular rhythm, normal heart sounds and intact distal pulses.   Pulmonary/Chest: Effort normal and breath sounds normal. She has no wheezes.  Abdominal: Soft. Bowel sounds are normal. She exhibits no mass. There is no tenderness. There is no guarding.   Musculoskeletal: Normal range of motion.  Lymphadenopathy:    She has no cervical adenopathy.  Neurological: She is alert and oriented to person, place, and time. She has normal reflexes. No cranial nerve deficit or sensory deficit. Gait normal. GCS eye subscore is 4. GCS verbal subscore is 5. GCS motor subscore is 6.  Reflex Scores:      Bicep reflexes are 2+ on the right side and 2+ on the left side.      Patellar reflexes are 2+ on the right side and 2+ on the left side. Strength is normal and equal throughout. Cranial nerves grossly intact. Patient fluent. No gross ataxia and patient able to ambulate without difficulty.  Skin: Skin is warm and dry.  Psychiatric: She has a normal mood and affect. Her behavior is normal. Judgment and thought content normal.  Nursing note and vitals reviewed.   ED Course  Procedures  DIAGNOSTIC STUDIES: Oxygen Saturation is 100% on RA, normal by my interpretation.  COORDINATION OF CARE:  9:49 PM Discussed treatment plan which includes head CT and pain medication with pt at bedside and pt agreed to plan.  Labs Review Labs Reviewed - No data to display  Imaging Review Ct Head Wo Contrast  12/17/2014   CLINICAL DATA:  Initial evaluation for several days history of headache, dizziness.  EXAM: CT HEAD WITHOUT CONTRAST  TECHNIQUE: Contiguous axial images were obtained from the base of the skull through the vertex without intravenous contrast.  COMPARISON:  Prior study from 11/02/2007  FINDINGS: There is no acute intracranial hemorrhage or infarct. No mass lesion or midline shift. Gray-white matter differentiation is well maintained. Ventricles are normal in size without evidence of hydrocephalus. CSF containing spaces are within normal limits. No extra-axial fluid collection.  The calvarium is intact.  Orbital soft tissues are within normal limits.  The paranasal sinuses and mastoid air cells are well pneumatized and free of fluid.  Scalp soft tissues are  unremarkable.  IMPRESSION: Negative head CT with no acute intracranial process identified.   Electronically Signed   By: Rise Mu M.D.   On: 12/17/2014 22:18     EKG Interpretation None      MDM   Final diagnoses:  Headache     I personally performed the services described in this documentation, which was scribed in my presence. The recorded information has been reviewed and considered.   Margarita Grizzle, MD 12/17/14 [phone]

## 2014-12-17 NOTE — Discharge Instructions (Signed)
General Headache Without Cause  A general headache is pain or discomfort felt around the head or neck area. The cause may not be found.   HOME CARE   · Keep all doctor visits.  · Only take medicines as told by your doctor.  · Lie down in a dark, quiet room when you have a headache.  · Keep a journal to find out if certain things bring on headaches. For example, write down:  ¨ What you eat and drink.  ¨ How much sleep you get.  ¨ Any change to your diet or medicines.  · Relax by getting a massage or doing other relaxing activities.  · Put ice or heat packs on the head and neck area as told by your doctor.  · Lessen stress.  · Sit up straight. Do not tighten (tense) your muscles.  · Quit smoking if you smoke.  · Lessen how much alcohol you drink.  · Lessen how much caffeine you drink, or stop drinking caffeine.  · Eat and sleep on a regular schedule.  · Get 7 to 9 hours of sleep, or as told by your doctor.  · Keep lights dim if bright lights bother you or make your headaches worse.  GET HELP RIGHT AWAY IF:   · Your headache becomes really bad.  · You have a fever.  · You have a stiff neck.  · You have trouble seeing.  · Your muscles are weak, or you lose muscle control.  · You lose your balance or have trouble walking.  · You feel like you will pass out (faint), or you pass out.  · You have really bad symptoms that are different than your first symptoms.  · You have problems with the medicines given to you by your doctor.  · Your medicines do not work.  · Your headache feels different than the other headaches.  · You feel sick to your stomach (nauseous) or throw up (vomit).  MAKE SURE YOU:   · Understand these instructions.  · Will watch your condition.  · Will get help right away if you are not doing well or get worse.  Document Released: 03/28/2008 Document Revised: 09/11/2011 Document Reviewed: 06/09/2011  ExitCare® Patient Information ©2015 [employer]. This information is not intended to replace advice given to  you by your health care provider. Make sure you discuss any questions you have with your health care provider.

## 2014-12-17 NOTE — ED Notes (Signed)
Reports mild nausea and diarrhea has subsided. Pt NAD. A/O X4  MD at bedside

## 2014-12-17 NOTE — ED Notes (Signed)
MD at bedside. 

## 2015-05-04 ENCOUNTER — Emergency Department (HOSPITAL_BASED_OUTPATIENT_CLINIC_OR_DEPARTMENT_OTHER): Payer: Self-pay

## 2015-05-04 ENCOUNTER — Emergency Department (HOSPITAL_BASED_OUTPATIENT_CLINIC_OR_DEPARTMENT_OTHER)
Admission: EM | Admit: 2015-05-04 | Discharge: 2015-05-04 | Disposition: A | Discharge status: Home / Self Care | Payer: Self-pay | Attending: Emergency Medicine | Admitting: Emergency Medicine

## 2015-05-04 ENCOUNTER — Encounter (HOSPITAL_BASED_OUTPATIENT_CLINIC_OR_DEPARTMENT_OTHER): Payer: Self-pay

## 2015-05-04 DIAGNOSIS — R059 Cough, unspecified: Secondary | ICD-10-CM

## 2015-05-04 DIAGNOSIS — J069 Acute upper respiratory infection, unspecified: Secondary | ICD-10-CM | POA: Insufficient documentation

## 2015-05-04 DIAGNOSIS — R062 Wheezing: Secondary | ICD-10-CM

## 2015-05-04 DIAGNOSIS — Z720 Tobacco use: Secondary | ICD-10-CM | POA: Insufficient documentation

## 2015-05-04 DIAGNOSIS — J029 Acute pharyngitis, unspecified: Secondary | ICD-10-CM | POA: Insufficient documentation

## 2015-05-04 DIAGNOSIS — R0602 Shortness of breath: Secondary | ICD-10-CM

## 2015-05-04 DIAGNOSIS — R05 Cough: Secondary | ICD-10-CM

## 2015-05-04 MED ORDER — IPRATROPIUM BROMIDE 0.02 % IN SOLN
0.5000 mg | Freq: Once | RESPIRATORY_TRACT | Status: AC
  Administered 2015-05-04: 0.5 mg via RESPIRATORY_TRACT
  Filled 2015-05-04: qty 2.5

## 2015-05-04 MED ORDER — PREDNISONE 20 MG PO TABS: ORAL_TABLET | ORAL | Status: AC

## 2015-05-04 MED ORDER — IPRATROPIUM BROMIDE 0.02 % IN SOLN
0.5000 mg | Freq: Once | RESPIRATORY_TRACT | Status: AC
Start: 2015-05-04 — End: 2015-05-04
  Administered 2015-05-04: 0.5 mg via RESPIRATORY_TRACT
  Filled 2015-05-04: qty 2.5

## 2015-05-04 MED ORDER — ALBUTEROL SULFATE HFA 108 (90 BASE) MCG/ACT IN AERS: 2.0000 | INHALATION_SPRAY | RESPIRATORY_TRACT | Status: AC | PRN

## 2015-05-04 MED ORDER — PREDNISONE 50 MG PO TABS
60.0000 mg | ORAL_TABLET | Freq: Once | ORAL | Status: AC
  Administered 2015-05-04: 60 mg via ORAL
  Filled 2015-05-04 (×2): qty 1

## 2015-05-04 MED ORDER — ALBUTEROL SULFATE (2.5 MG/3ML) 0.083% IN NEBU
5.0000 mg | INHALATION_SOLUTION | Freq: Once | RESPIRATORY_TRACT | Status: AC
  Administered 2015-05-04: 5 mg via RESPIRATORY_TRACT
  Filled 2015-05-04: qty 6

## 2015-05-04 NOTE — ED Provider Notes (Signed)
CSN: [id number]     Arrival date & time 05/04/15  1409 History   First MD Initiated Contact with Patient 05/04/15 1611     Chief Complaint  Patient presents with  . Shortness of Breath     (Consider location/radiation/quality/duration/timing/severity/associated sxs/prior Treatment) HPI Comments: Rose Lowe is a 34 y.o. female with a PMHx of tobacco use, who presents to the ED with complaints of URI symptoms 3 days. Symptoms include dry cough, clear yellowish rhinorrhea, sore throat, wheezing, and mild shortness of breath. Symptoms worsen with coughing, and have been unrelieved with Alka-Seltzer and Mucinex at home. Positive sick contacts at home. Patient admits to being a smoker. She denies any ear pain or drainage, drooling, trismus, fevers, chills, hemoptysis, leg swelling, recent travel/surgery/immobilization, OCPs, history of DVT/PE, chest pain, abdominal pain, nausea, vomiting, diarrhea, constipation, melena, hematochezia, dysuria, hematuria, numbness, tingling, or weakness.  Patient is a 34 y.o. female presenting with shortness of breath. The history is provided by the patient. No language interpreter was used.  Shortness of Breath Severity:  Mild Onset quality:  Gradual Duration:  3 days Timing:  Constant Progression:  Unchanged Chronicity:  New Context: URI   Relieved by:  Nothing Worsened by:  Coughing Ineffective treatments: alka seltzer and mucinex. Associated symptoms: cough, sore throat and wheezing   Associated symptoms: no abdominal pain, no chest pain, no ear pain, no fever, no hemoptysis, no sputum production and no vomiting   Risk factors: tobacco use   Risk factors: no family hx of DVT, no hx of PE/DVT, no oral contraceptive use, no prolonged immobilization and no recent surgery     History reviewed. No pertinent past medical history. History reviewed. No pertinent past surgical history. Family History  Problem Relation Age of Onset  . Asthma Mother   .  Rheum arthritis Mother   . Hypertension Mother    Social History  Substance Use Topics  . Smoking status: Current Some Day Smoker  . Smokeless tobacco: Never Used  . Alcohol Use: No   OB History    No data available     Review of Systems  Constitutional: Negative for fever and chills.  HENT: Positive for rhinorrhea, sinus pressure and sore throat. Negative for drooling, ear discharge, ear pain and trouble swallowing.   Respiratory: Positive for cough, shortness of breath and wheezing. Negative for hemoptysis and sputum production.   Cardiovascular: Negative for chest pain and leg swelling.  Gastrointestinal: Negative for nausea, vomiting, abdominal pain, diarrhea, constipation and blood in stool.  Genitourinary: Negative for dysuria and hematuria.  Musculoskeletal: Negative for myalgias and arthralgias.  Skin: Negative for color change.  Allergic/Immunologic: Negative for immunocompromised state.  Neurological: Negative for weakness and numbness.  Psychiatric/Behavioral: Negative for confusion.   10 Systems reviewed and are negative for acute change except as noted in the HPI.    Allergies  Review of patient's allergies indicates no known allergies.  Home Medications   Prior to Admission medications   Not on File   BP 163/94 mmHg  Pulse 100  Temp(Src) 99 F (37.2 C) (Oral)  Resp 18  Ht  (1.702 m)  Wt 155 lb (70.308 kg)  BMI 24.27 kg/m2  SpO2 100%  LMP 04/21/2015 Physical Exam  Constitutional: She is oriented to person, place, and time. Vital signs are normal. She appears well-developed and well-nourished.  Non-toxic appearance. No distress.  Afebrile, nontoxic, NAD  HENT:  Head: Normocephalic and atraumatic.  Nose: Mucosal edema and rhinorrhea present.  Mouth/Throat: Uvula  is midline, oropharynx is clear and moist and mucous membranes are normal. No trismus in the jaw. No uvula swelling.  Nose with mucosal edema and clear rhinorrhea. Oropharynx clear and  moist, without uvular swelling or deviation, no trismus or drooling, no tonsillar swelling or erythema, no exudates.    Eyes: Conjunctivae and EOM are normal. Right eye exhibits no discharge. Left eye exhibits no discharge.  Neck: Normal range of motion. Neck supple.  Cardiovascular: Normal rate, regular rhythm, normal heart sounds and intact distal pulses.  Exam reveals no gallop and no friction rub.   No murmur heard. Pulmonary/Chest: Effort normal. No respiratory distress. She has no decreased breath sounds. She has wheezes. She has rhonchi. She has no rales.  Diffuse expiratory wheezing, some rhonchi in lower fields, no rales, no hypoxia or increased WOB, speaking in full sentences, SpO2 100% on RA   Abdominal: Soft. Normal appearance and bowel sounds are normal. She exhibits no distension. There is no tenderness. There is no rigidity, no rebound and no guarding.  Musculoskeletal: Normal range of motion.  MAE x4 Strength and sensation grossly intact Distal pulses intact No pedal edema, neg homan's bilaterally   Neurological: She is alert and oriented to person, place, and time. She has normal strength. No sensory deficit.  Skin: Skin is warm, dry and intact. No rash noted.  Psychiatric: She has a normal mood and affect.  Nursing note and vitals reviewed.   ED Course  Procedures (including critical care time) Labs Review Labs Reviewed - No data to display  Imaging Review Dg Chest 2 View  05/04/2015  CLINICAL DATA:  Cough, difficulty breathing, wheezing EXAM: CHEST  2 VIEW COMPARISON:  09/10/2012 FINDINGS: Lungs are clear.  No pleural effusion or pneumothorax. The heart is normal in size. Visualized osseous structures are within normal limits. IMPRESSION: No evidence of acute cardiopulmonary disease. Electronically Signed   By: Charline BillsSriyesh  Krishnan M.D.   On: 05/04/2015 17:13   I have personally reviewed and evaluated these images and lab results as part of my medical decision-making.    EKG Interpretation None      MDM   Final diagnoses:  Cough  Wheezing  Tobacco use  SOB (shortness of breath)  URI (upper respiratory infection)  Pharyngitis    34 y.o. female here with cough and URI symptoms x3 days, wheezing and SOB. On exam, rhonchi in lower fields, wheezing throughout. Will obtain CXR and give prednisone/nebs. Will reassess shortly.   5:57 PM CXR clear. Lungs improved but still wheezing in lower fields, will repeat neb treatment then reassess shortly. Pt feels improved at this time.  6:45 PM Lungs improved, feels better. Discussed OTC remedies, will send home with inhaler and prednisone. F/up with PCP in 1wk. I explained the diagnosis and have given explicit precautions to return to the ER including for any other new or worsening symptoms. The patient understands and accepts the medical plan as it's been dictated and I have answered their questions. Discharge instructions concerning home care and prescriptions have been given. The patient is STABLE and is discharged to home in good condition.  BP 136/98 mmHg  Pulse 92  Temp(Src) 99.1 F (37.3 C) (Oral)  Resp 16  Ht 5\' 7"  (1.702 m)  Wt 155 lb (70.308 kg)  BMI 24.27 kg/m2  SpO2 100%  LMP 04/21/2015  Meds ordered this encounter  Medications  . predniSONE (DELTASONE) tablet 60 mg    Sig:   . albuterol (PROVENTIL) (2.5 MG/3ML) 0.083% nebulizer solution  5 mg    Sig:   . ipratropium (ATROVENT) nebulizer solution 0.5 mg    Sig:   . albuterol (PROVENTIL) (2.5 MG/3ML) 0.083% nebulizer solution 5 mg    Sig:   . ipratropium (ATROVENT) nebulizer solution 0.5 mg    Sig:   . albuterol (PROVENTIL HFA;VENTOLIN HFA) 108 (90 BASE) MCG/ACT inhaler    Sig: Inhale 2 puffs into the lungs every 2 (two) hours as needed for wheezing or shortness of breath (cough).    Dispense:  1 Inhaler    Refill:  0    Order Specific Question:  Supervising Provider    Answer:  MILLER, BRIAN [[id number]]  . predniSONE (DELTASONE) 20 MG  tablet    Sig: 3 tabs po daily x 4 days    Dispense:  12 tablet    Refill:  0    Order Specific Question:  Supervising Provider    Answer:  Eusebio Friendly     Nealy Karapetian Camprubi-Soms, PA-C 05/04/15 1846  Mirian Mo, MD 05/07/15 1011

## 2015-05-04 NOTE — ED Notes (Signed)
PA at bedside.

## 2015-05-04 NOTE — ED Notes (Signed)
Patient transported to X-ray 

## 2015-05-04 NOTE — ED Notes (Signed)
nonprod cough x 2-3 days

## 2015-05-04 NOTE — Discharge Instructions (Signed)
Continue to stay well-hydrated. Gargle warm salt water and spit it out. Continue to alternate between Tylenol and Ibuprofen for pain or fever. Use Mucinex for cough suppression/expectoration of mucus. Use netipot and flonase to help with nasal congestion. May consider over-the-counter Benadryl or other antihistamine to decrease secretions and for watery itchy eyes. Take prednisone as directed. Use inhaler as directed, as needed for cough/chest congestion. Followup with your primary care doctor in 5-7 days for recheck of ongoing symptoms. Return to emergency department for emergent changing or worsening of symptoms.   Cough, Adult A cough helps to clear your throat and lungs. A cough may last only 2-3 weeks (acute), or it may last longer than 8 weeks (chronic). Many different things can cause a cough. A cough may be a sign of an illness or another medical condition. HOME CARE  Pay attention to any changes in your cough.  Take medicines only as told by your doctor.  If you were prescribed an antibiotic medicine, take it as told by your doctor. Do not stop taking it even if you start to feel better.  Talk with your doctor before you try using a cough medicine.  Drink enough fluid to keep your pee (urine) clear or pale yellow.  If the air is dry, use a cold steam vaporizer or humidifier in your home.  Stay away from things that make you cough at work or at home.  If your cough is worse at night, try using extra pillows to raise your head up higher while you sleep.  Do not smoke, and try not to be around smoke. If you need help quitting, ask your doctor.  Do not have caffeine.  Do not drink alcohol.  Rest as needed. GET HELP IF:  You have new problems (symptoms).  You cough up yellow fluid (pus).  Your cough does not get better after 2-3 weeks, or your cough gets worse.  Medicine does not help your cough and you are not sleeping well.  You have pain that gets worse or pain that is  not helped with medicine.  You have a fever.  You are losing weight and you do not know why.  You have night sweats. GET HELP RIGHT AWAY IF:  You cough up blood.  You have trouble breathing.  Your heartbeat is very fast.   This information is not intended to replace advice given to you by your health care provider. Make sure you discuss any questions you have with your health care provider.   Document Released: 03/02/2011 Document Revised: 03/10/2015 Document Reviewed: 08/26/2014 Elsevier Interactive Patient Education 2016 [employer]  Shortness of Breath Shortness of breath means you have trouble breathing. Shortness of breath needs medical care right away. HOME CARE   Do not smoke.  Avoid being around chemicals or things (paint fumes, dust) that may bother your breathing.  Rest as needed. Slowly begin your normal activities.  Only take medicines as told by your doctor.  Keep all doctor visits as told. GET HELP RIGHT AWAY IF:   Your shortness of breath gets worse.  You feel lightheaded, pass out (faint), or have a cough that is not helped by medicine.  You cough up blood.  You have pain with breathing.  You have pain in your chest, arms, shoulders, or belly (abdomen).  You have a fever.  You cannot walk up stairs or exercise the way you normally do.  You do not get better in the time expected.  You have  a hard time doing normal activities even with rest.  You have problems with your medicines.  You have any new symptoms. MAKE SURE YOU:  Understand these instructions.  Will watch your condition.  Will get help right away if you are not doing well or get worse.   This information is not intended to replace advice given to you by your health care provider. Make sure you discuss any questions you have with your health care provider.   Document Released: 12/06/2007 Document Revised: 06/24/2013 Document Reviewed: 09/04/2011 Elsevier Interactive Patient  Education 2016 [employer] Inc[employer]  Pharyngitis Pharyngitis is a sore throat (pharynx). There is redness, pain, and swelling of your throat. HOME CARE   Drink enough fluids to keep your pee (urine) clear or pale yellow.  Only take medicine as told by your doctor.  You may get sick again if you do not take medicine as told. Finish your medicines, even if you start to feel better.  Do not take aspirin.  Rest.  Rinse your mouth (gargle) with salt water ( tsp of salt per 1 qt of water) every 1-2 hours. This will help the pain.  If you are not at risk for choking, you can suck on hard candy or sore throat lozenges. GET HELP IF:  You have large, tender lumps on your neck.  You have a rash.  You cough up green, yellow-brown, or bloody spit. GET HELP RIGHT AWAY IF:   You have a stiff neck.  You drool or cannot swallow liquids.  You throw up (vomit) or are not able to keep medicine or liquids down.  You have very bad pain that does not go away with medicine.  You have problems breathing (not from a stuffy nose). MAKE SURE YOU:   Understand these instructions.  Will watch your condition.  Will get help right away if you are not doing well or get worse.   This information is not intended to replace advice given to you by your health care provider. Make sure you discuss any questions you have with your health care provider.   Document Released: 12/06/2007 Document Revised: 04/09/2013 Document Reviewed: 02/24/2013 Elsevier Interactive Patient Education 2016 Else[employer] Inc.  Viral Infections A virus is a type of germ. Viruses can cause:  Minor sore throats.  Aches and pains.  Headaches.  Runny nose.  Rashes.  Watery eyes.  Tiredness.  Coughs.  Loss of appetite.  Feeling sick to your stomach (nausea).  Throwing up (vomiting).  Watery poop (diarrhea). HOME CARE   Only take medicines as told by your doctor.  Drink enough water and fluids to keep your pee  (urine) clear or pale yellow. Sports drinks are a good choice.  Get plenty of rest and eat healthy. Soups and broths with crackers or rice are fine. GET HELP RIGHT AWAY IF:   You have a very bad headache.  You have shortness of breath.  You have chest pain or neck pain.  You have an unusual rash.  You cannot stop throwing up.  You have watery poop that does not stop.  You cannot keep fluids down.  You or your child has a temperature by mouth above 102 F (38.9 C), not controlled by medicine.  Your baby is older than 3 months with a rectal temperature of 102 F (38.9 C) or higher.  Your baby is 563 months old or younger with a rectal temperature of 100.4 F (38 C) or higher. MAKE SURE YOU:   Understand these  instructions.  Will watch this condition.  Will get help right away if you are not doing well or get worse.   This information is not intended to replace advice given to you by your health care provider. Make sure you discuss any questions you have with your health care provider.   Document Released: 06/01/2008 Document Revised: 09/11/2011 Document Reviewed: 11/25/2014 Elsevier Interactive Patient Education 2016 [employer].  Smoking Cessation, Tips for Success If you are ready to quit smoking, congratulations! You have chosen to help yourself be healthier. Cigarettes bring nicotine, tar, carbon monoxide, and other irritants into your body. Your lungs, heart, and blood vessels will be able to work better without these poisons. There are many different ways to quit smoking. Nicotine gum, nicotine patches, a nicotine inhaler, or nicotine nasal spray can help with physical craving. Hypnosis, support groups, and medicines help break the habit of smoking. WHAT THINGS CAN I DO TO MAKE QUITTING EASIER?  Here are some tips to help you quit for good:  Pick a date when you will quit smoking completely. Tell all of your friends and family about your plan to quit on that  date.  Do not try to slowly cut down on the number of cigarettes you are smoking. Pick a quit date and quit smoking completely starting on that day.  Throw away all cigarettes.   Clean and remove all ashtrays from your home, work, and car.  On a card, write down your reasons for quitting. Carry the card with you and read it when you get the urge to smoke.  Cleanse your body of nicotine. Drink enough water and fluids to keep your urine clear or pale yellow. Do this after quitting to flush the nicotine from your body.  Learn to predict your moods. Do not let a bad situation be your excuse to have a cigarette. Some situations in your life might tempt you into wanting a cigarette.  Never have "just one" cigarette. It leads to wanting another and another. Remind yourself of your decision to quit.  Change habits associated with smoking. If you smoked while driving or when feeling stressed, try other activities to replace smoking. Stand up when drinking your coffee. Brush your teeth after eating. Sit in a different chair when you read the paper. Avoid alcohol while trying to quit, and try to drink fewer caffeinated beverages. Alcohol and caffeine may urge you to smoke.  Avoid foods and drinks that can trigger a desire to smoke, such as sugary or spicy foods and alcohol.  Ask people who smoke not to smoke around you.  Have something planned to do right after eating or having a cup of coffee. For example, plan to take a walk or exercise.  Try a relaxation exercise to calm you down and decrease your stress. Remember, you may be tense and nervous for the first 2 weeks after you quit, but this will pass.  Find new activities to keep your hands busy. Play with a pen, coin, or rubber band. Doodle or draw things on paper.  Brush your teeth right after eating. This will help cut down on the craving for the taste of tobacco after meals. You can also try mouthwash.   Use oral substitutes in place of  cigarettes. Try using lemon drops, carrots, cinnamon sticks, or chewing gum. Keep them handy so they are available when you have the urge to smoke.  When you have the urge to smoke, try deep breathing.  Designate your home  as a nonsmoking area.  If you are a heavy smoker, ask your health care provider about a prescription for nicotine chewing gum. It can ease your withdrawal from nicotine.  Reward yourself. Set aside the cigarette money you save and buy yourself something nice.  Look for support from others. Join a support group or smoking cessation program. Ask someone at home or at work to help you with your plan to quit smoking.  Always ask yourself, "Do I need this cigarette or is this just a reflex?" Tell yourself, "Today, I choose not to smoke," or "I do not want to smoke." You are reminding yourself of your decision to quit.  Do not replace cigarette smoking with electronic cigarettes (commonly called e-cigarettes). The safety of e-cigarettes is unknown, and some may contain harmful chemicals.  If you relapse, do not give up! Plan ahead and think about what you will do the next time you get the urge to smoke. HOW WILL I FEEL WHEN I QUIT SMOKING? You may have symptoms of withdrawal because your body is used to nicotine (the addictive substance in cigarettes). You may crave cigarettes, be irritable, feel very hungry, cough often, get headaches, or have difficulty concentrating. The withdrawal symptoms are only temporary. They are strongest when you first quit but will go away within 10-14 days. When withdrawal symptoms occur, stay in control. Think about your reasons for quitting. Remind yourself that these are signs that your body is healing and getting used to being without cigarettes. Remember that withdrawal symptoms are easier to treat than the major diseases that smoking can cause.  Even after the withdrawal is over, expect periodic urges to smoke. However, these cravings are generally  short lived and will go away whether you smoke or not. Do not smoke! WHAT RESOURCES ARE AVAILABLE TO HELP ME QUIT SMOKING? Your health care provider can direct you to community resources or hospitals for support, which may include:  Group support.  Education.  Hypnosis.  Therapy.   This information is not intended to replace advice given to you by your health care provider. Make sure you discuss any questions you have with your health care provider.   Document Released: 03/17/2004 Document Revised: 07/10/2014 Document Reviewed: 12/05/2012 Elsevier Interactive Patient Education [employer]

## 2016-10-27 IMAGING — CT CT HEAD W/O CM
1 series · 16 of 30 positions shown, 20 images · non-contrast
Comparison: Prior study from 11/02/2007

CLINICAL DATA: Initial evaluation for several days history of
headache, dizziness.

EXAM:
CT HEAD WITHOUT CONTRAST
TECHNIQUE: Contiguous axial images were obtained from the base of the skull
through the vertex without intravenous contrast.

[Series 2: head 4.8 h37s · axial · 0.45mm/px · z∈[-176,-24]mm · 16 of 36 slices shown, 20 images]
[im 2/36  brain]
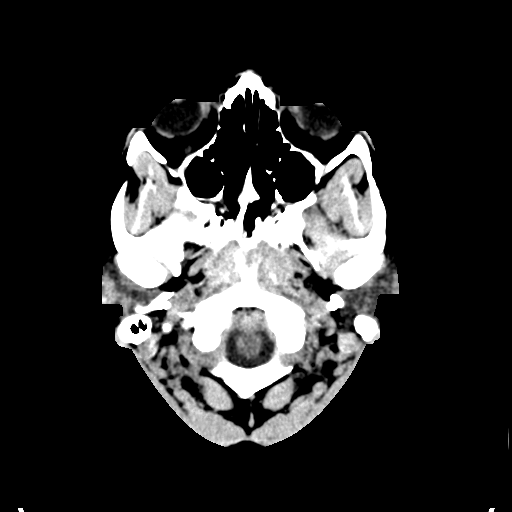
[im 2/36  bone]
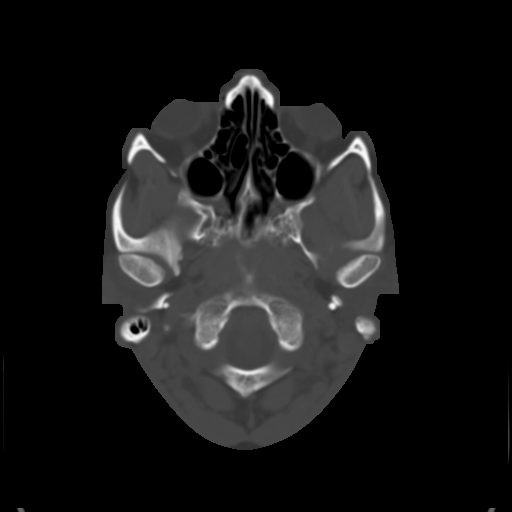
[im 4/36  brain]
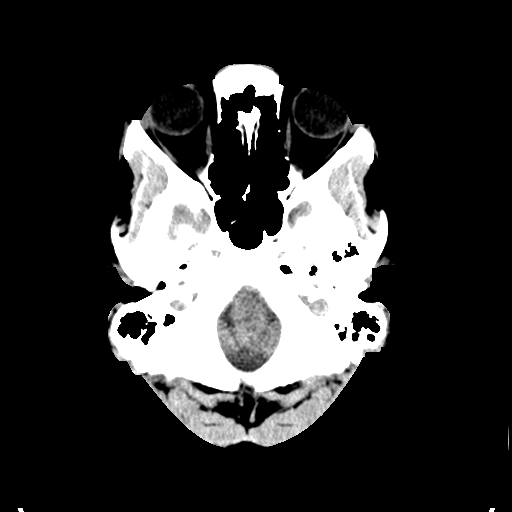
[im 7/36  brain]
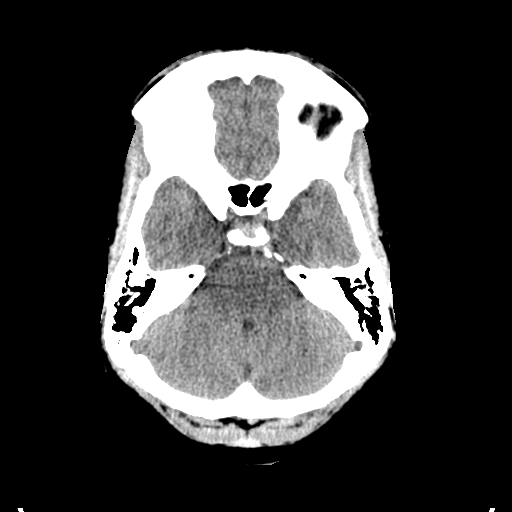
[im 9/36  brain]
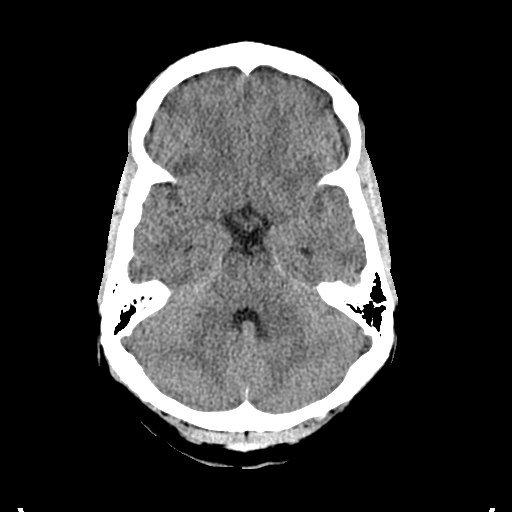
[im 10/36  brain]
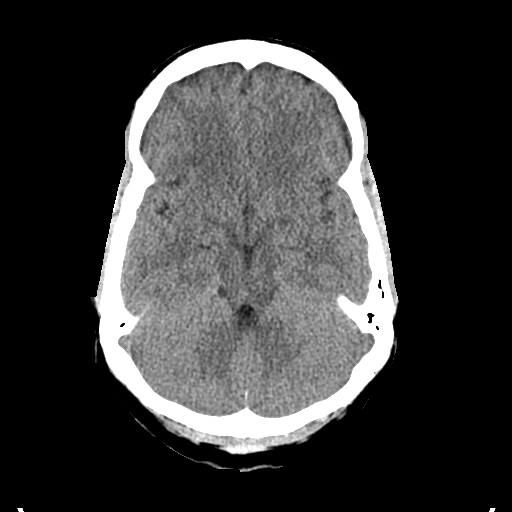
[im 10/36  bone]
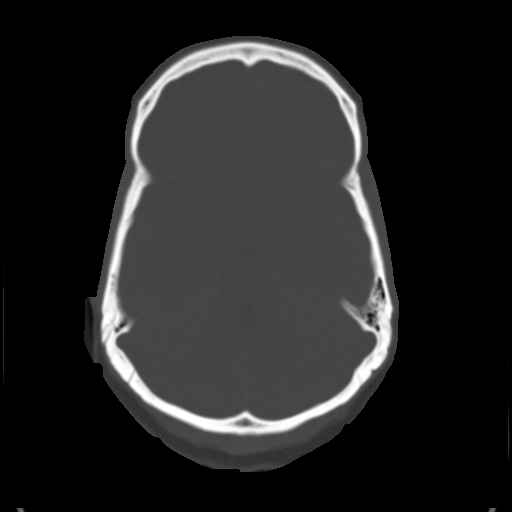
[im 13/36  brain]
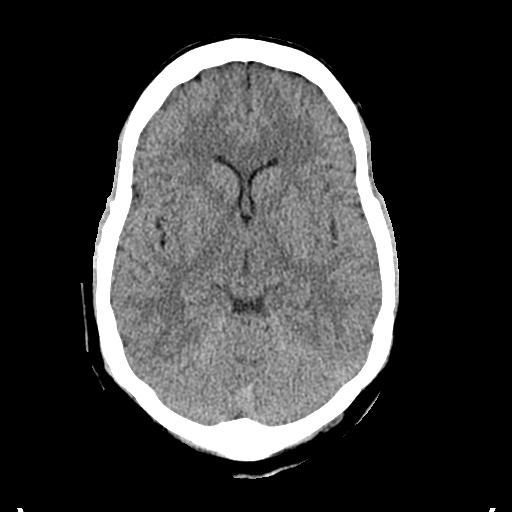
[im 15/36  brain]
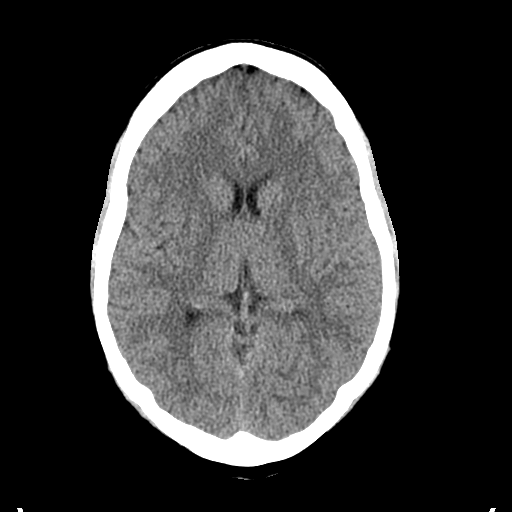
[im 17/36  brain]
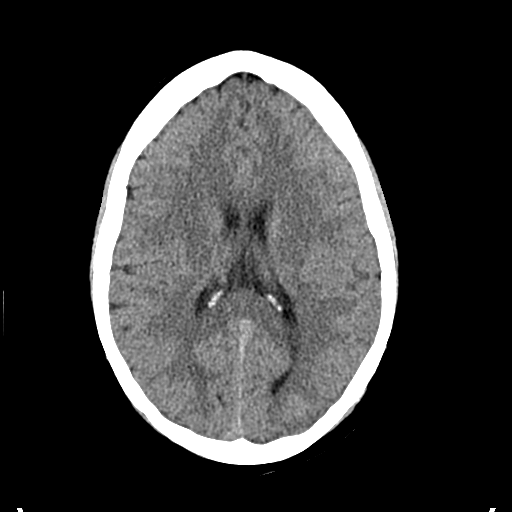
[im 19/36  brain]
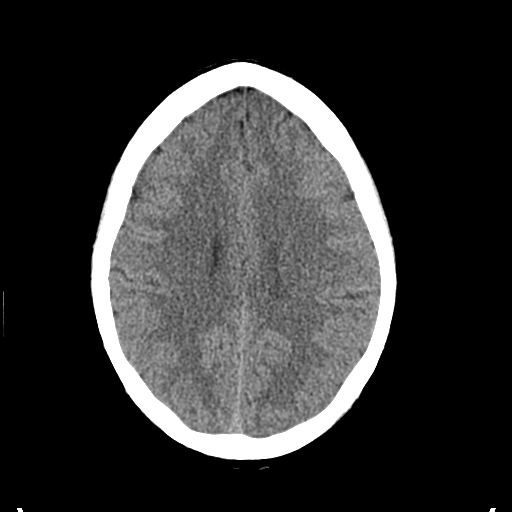
[im 19/36  bone]
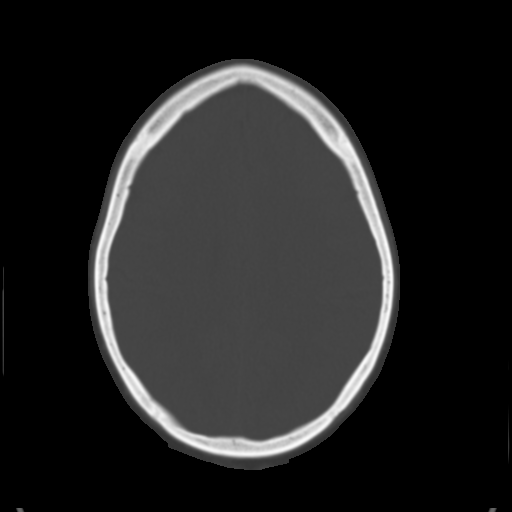
[im 21/36  brain]
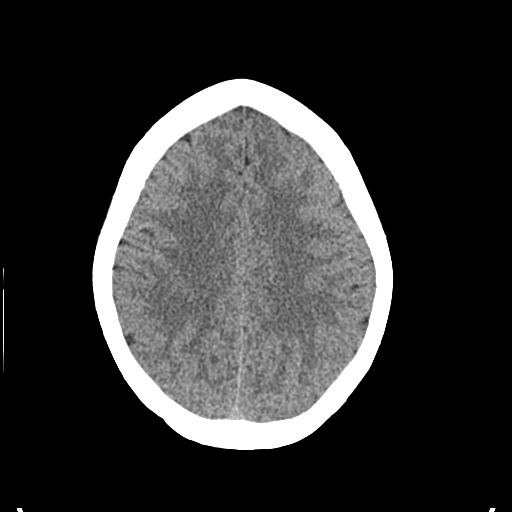
[im 23/36  brain]
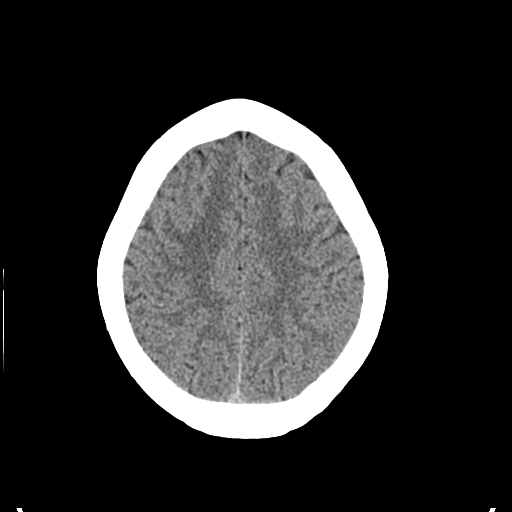
[im 26/36  brain]
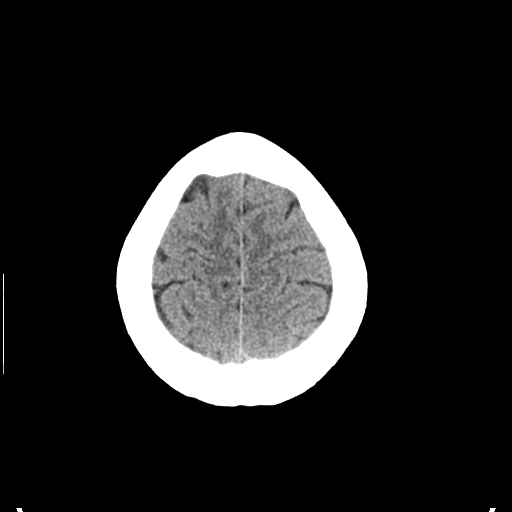
[im 27/36  brain]
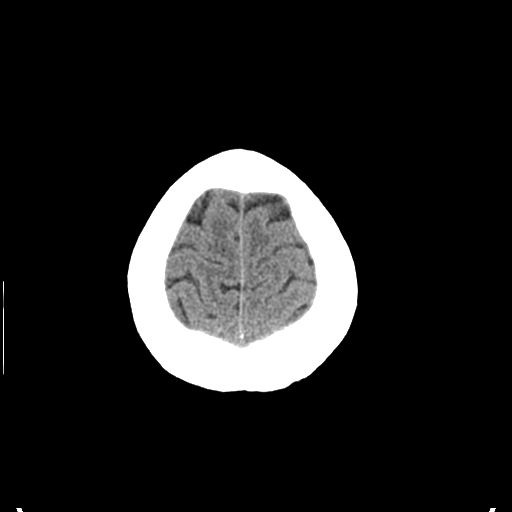
[im 27/36  bone]
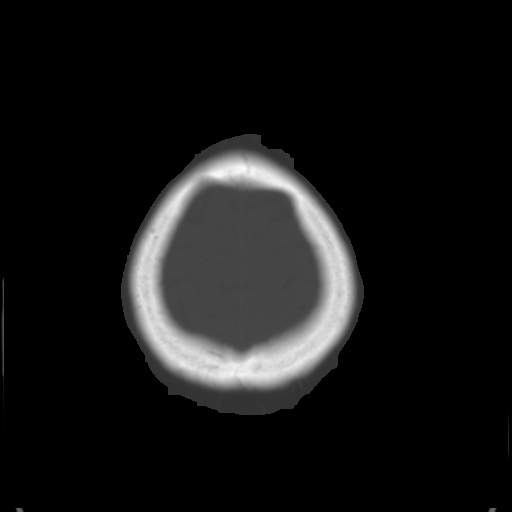
[im 29/36  brain]
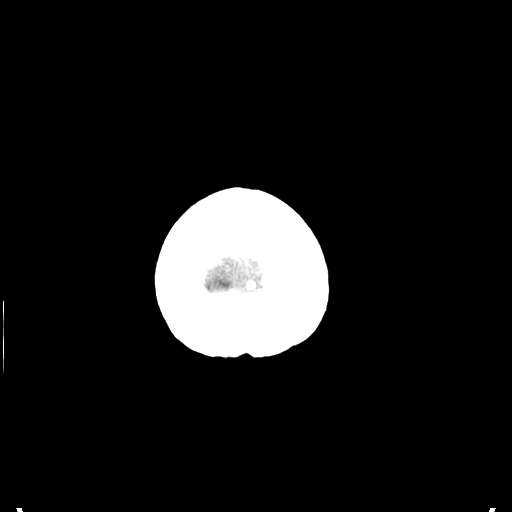
[im 32/36  brain]
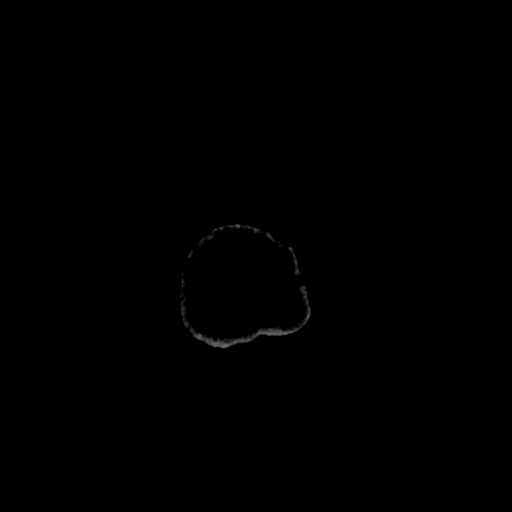
[im 34/36  brain]
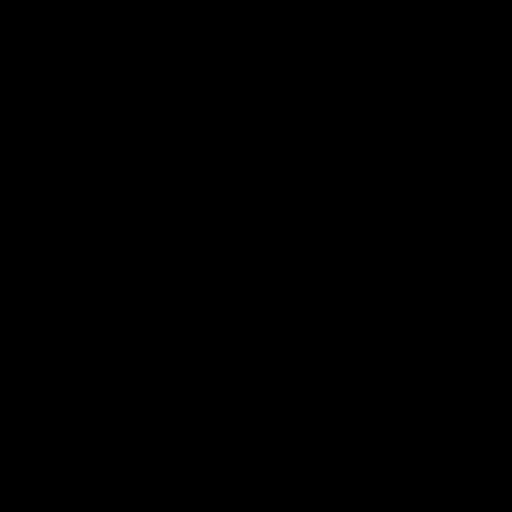

[16 of 30 positions shown; findings below may reference images not displayed]

FINDINGS: There is no acute intracranial hemorrhage or infarct. No mass lesion
or midline shift. Gray-white matter differentiation is well
maintained. Ventricles are normal in size without evidence of
hydrocephalus. CSF containing spaces are within normal limits. No
extra-axial fluid collection.

The calvarium is intact.

Orbital soft tissues are within normal limits.

The paranasal sinuses and mastoid air cells are well pneumatized and
free of fluid.

Scalp soft tissues are unremarkable.
IMPRESSION: Negative head CT with no acute intracranial process identified.

## 2016-11-19 ENCOUNTER — Emergency Department (HOSPITAL_BASED_OUTPATIENT_CLINIC_OR_DEPARTMENT_OTHER): Payer: Self-pay

## 2016-11-19 ENCOUNTER — Emergency Department (HOSPITAL_BASED_OUTPATIENT_CLINIC_OR_DEPARTMENT_OTHER)
Admission: EM | Admit: 2016-11-19 | Discharge: 2016-11-19 | Disposition: A | Discharge status: Home / Self Care | Payer: Self-pay | Attending: Emergency Medicine | Admitting: Emergency Medicine

## 2016-11-19 ENCOUNTER — Encounter (HOSPITAL_BASED_OUTPATIENT_CLINIC_OR_DEPARTMENT_OTHER): Payer: Self-pay | Admitting: *Deleted

## 2016-11-19 DIAGNOSIS — R0789 Other chest pain: Secondary | ICD-10-CM

## 2016-11-19 DIAGNOSIS — R079 Chest pain, unspecified: Secondary | ICD-10-CM | POA: Insufficient documentation

## 2016-11-19 DIAGNOSIS — F17200 Nicotine dependence, unspecified, uncomplicated: Secondary | ICD-10-CM | POA: Insufficient documentation

## 2016-11-19 LAB — CBC
HCT: 36.7 % (ref 36.0–46.0)
Hemoglobin: 12.5 g/dL (ref 12.0–15.0)
MCH: 30.1 pg (ref 26.0–34.0)
MCHC: 34.1 g/dL (ref 30.0–36.0)
MCV: 88.4 fL (ref 78.0–100.0)
PLATELETS: 252 10*3/uL (ref 150–400)
RBC: 4.15 MIL/uL (ref 3.87–5.11)
RDW: 11.6 % (ref 11.5–15.5)
WBC: 6.9 10*3/uL (ref 4.0–10.5)

## 2016-11-19 LAB — BASIC METABOLIC PANEL
ANION GAP: 6 (ref 5–15)
BUN: 10 mg/dL (ref 6–20)
CHLORIDE: 106 mmol/L (ref 101–111)
CO2: 26 mmol/L (ref 22–32)
CREATININE: 0.71 mg/dL (ref 0.44–1.00)
Calcium: 9.3 mg/dL (ref 8.9–10.3)
GFR calc non Af Amer: >60 mL/min (ref >60)
Glucose, Bld: 91 mg/dL (ref 65–99)
Potassium: 3.3 mmol/L — ABNORMAL LOW (ref 3.5–5.1)
SODIUM: 138 mmol/L (ref 135–145)

## 2016-11-19 LAB — TROPONIN I

## 2016-11-19 LAB — D-DIMER, QUANTITATIVE: D-Dimer, Quant: <0.27 ug/mL-FEU (ref 0.00–0.50)

## 2016-11-19 MED ORDER — NAPROXEN 500 MG PO TABS: 500.0000 mg | ORAL_TABLET | Freq: Two times a day (BID) | ORAL | 0 refills | Status: AC

## 2016-11-19 NOTE — ED Notes (Signed)
Pt states she has had left CP x 1 week, but s/s have gradually gotten worse. C/O diaphoresis and dizziness today.

## 2016-11-19 NOTE — Discharge Instructions (Signed)
Return to the ED with any concerns including difficulty breathing, fainting, leg swelling, worsening pain, decreased level of alertness/lethargy, or any other alarming symptoms °

## 2016-11-19 NOTE — ED Notes (Signed)
Pt given d/c instructions as per chart. rx x 1. Verbalizes understanding. No questions. 

## 2016-11-19 NOTE — ED Triage Notes (Signed)
Pt is here for left sided chest pain which is sharp and shooting.  Pt states that it increases with deep breathing, listing her arms and palpation.  No sob with this.  Pt reports some light headedness with this.  Pt is hypertensive in triage, she states that she is not on BP pills but has been told on in the past to "watch BP"

## 2016-11-19 NOTE — ED Provider Notes (Signed)
[facility] Provider Note   CSN: [id number] Arrival date & time: 11/19/16  1739  By signing my name below, I, Modena Jansky, attest that this documentation has been prepared under the direction and in the presence of Jerelyn Scott, MD. Electronically Signed: Modena Jansky, Scribe. 11/19/2016. 6:54 PM.  History   Chief Complaint Chief Complaint  Patient presents with  . Chest Pain   The history is provided by the patient. No language interpreter was used.  Chest Pain   This is a new problem. The current episode started more than 2 days ago. The problem occurs constantly. The problem has been rapidly improving. The pain is present in the lateral region. The pain is moderate. The quality of the pain is described as sharp. Pertinent negatives include no cough, no lower extremity edema, no shortness of breath and no syncope. She has tried nothing for the symptoms.  Pertinent negatives for past medical history include no hypertension.   HPI Comments: Rose Lowe is a 36 y.o. female who presents to the Emergency Department complaining of constant moderate left-sided chest pain that started about a week ago. She states her pain suddenly worsened today. She describes the pain beneath her breast as sharp and exacerbated by deep breathing. No further modifying factors. She reports associated lightheadedness.  She admits to 5-hour drive a few days ago for her job.  Denies any hx of HTN, recent hormone treatment, cough, leg swelling, SOB, LOC, or other complaints at this time.  History reviewed. No pertinent past medical history.  There are no active problems to display for this patient.   History reviewed. No pertinent surgical history.  OB History    No data available       Home Medications    Prior to Admission medications   Medication Sig Start Date End Date Taking? Authorizing Provider  albuterol (PROVENTIL HFA;VENTOLIN HFA) 108 (90 BASE) MCG/ACT inhaler Inhale 2  puffs into the lungs every 2 (two) hours as needed for wheezing or shortness of breath (cough). 05/04/15   Street, Mercedes, PA-C  naproxen (NAPROSYN) 500 MG tablet Take 1 tablet (500 mg total) by mouth 2 (two) times daily. 11/19/16   Jerelyn Scott, MD  predniSONE (DELTASONE) 20 MG tablet 3 tabs po daily x 4 days 05/04/15   Street, [place], [place]    Family History Family History  Problem Relation Age of Onset  . Asthma Mother   . Rheum arthritis Mother   . Hypertension Mother     Social History Social History  Substance Use Topics  . Smoking status: Current Some Day Smoker  . Smokeless tobacco: Never Used  . Alcohol use No     Allergies   Patient has no known allergies.   Review of Systems Review of Systems  Respiratory: Negative for cough and shortness of breath.   Cardiovascular: Positive for chest pain (Left-sided). Negative for leg swelling and syncope.  Neurological: Positive for light-headedness. Negative for syncope.  All other systems reviewed and are negative.    Physical Exam Updated Vital Signs BP (!) 165/100 (BP Location: Left Arm)   Pulse 82   Temp 99 F (37.2 C) (Oral)   Resp 16   Wt 155 lb (70.3 kg)   LMP 11/13/2016   SpO2 100%   BMI 24.28 kg/m  Vitals reviewed Physical Exam Physical Examination: General appearance - alert, well appearing, and in no distress Mental status - alert, oriented to person, place, and time Eyes - no conjunctival injection, no scleral  icterus Mouth - mucous membranes moist, pharynx normal without lesions Neck - supple, no significant adenopathy Chest - clear to auscultation, no wheezes, rales or rhonchi, symmetric air entry, nontender to palpation Heart - normal rate, regular rhythm, normal S1, S2, no murmurs, rubs, clicks or gallops Abdomen - soft, nontender, nondistended, no masses or organomegaly Neurological - alert, oriented, normal speech Extremities - peripheral pulses normal, no pedal edema, no clubbing or  cyanosis Skin - normal coloration and turgor, no rashes  ED Treatments / Results  DIAGNOSTIC STUDIES: Oxygen Saturation is 100% on RA, normal by my interpretation.    COORDINATION OF CARE: 6:58 PM- Pt advised of plan for treatment and pt agrees.  Labs (all labs ordered are listed, but only abnormal results are displayed) Labs Reviewed  BASIC METABOLIC PANEL - Abnormal; Notable for the following:       Result Value   Potassium 3.3 (*)    All other components within normal limits  CBC  TROPONIN I  D-DIMER, QUANTITATIVE (NOT AT [facility])    EKG  EKG Interpretation  Date/Time:  Sunday Nov 19 2016 17:45:07 EDT Ventricular Rate:  84 PR Interval:  130 QRS Duration: 74 QT Interval:  356 QTC Calculation: 420 R Axis:   81 Text Interpretation:  Normal sinus rhythm Normal ECG No significant change since last tracing Confirmed by Jerelyn ScottLinker, Laiklyn Pilkenton [phone]) on 11/19/2016 6:36:41 PM Also confirmed by Jerelyn ScottLinker, Kamonte Mcmichen [phone]), editor Elita QuickWatlington, [place] ([id number])  on 11/20/2016 6:59:25 AM       Radiology No results found.  Procedures Procedures (including critical care time)  Medications Ordered in ED Medications - No data to display   Initial Impression / Assessment and Plan / ED Course  I have reviewed the triage vital signs and the nursing notes.  Pertinent labs & imaging results that were available during my care of the patient were reviewed by me and considered in my medical decision making (see chart for details).     Pt presenting with c/o sharp chest pain that has been intermittent for the past several days, pain is worse with deep breathing.  Pt has low heart score, doubt ACS. She does drive long distances for work, therefore d-dimer checked and was negative.  CXR and EKG also reassuring.  Discharged with strict return precautions.  Pt agreeable with plan.  Final Clinical Impressions(s) / ED Diagnoses   Final diagnoses:  Atypical chest pain    New Prescriptions Discharge  Medication List as of 11/19/2016  7:52 PM    START taking these medications   Details  naproxen (NAPROSYN) 500 MG tablet Take 1 tablet (500 mg total) by mouth 2 (two) times daily., Starting Sun 11/19/2016, Print       I personally performed the services described in this documentation, which was scribed in my presence. The recorded information has been reviewed and is accurate.      Jerelyn ScottLinker, Lilburn Straw, MD 11/22/16 [phone]

## 2016-11-19 NOTE — ED Notes (Signed)
Patient transported to X-ray 

## 2018-01-27 ENCOUNTER — Emergency Department (HOSPITAL_BASED_OUTPATIENT_CLINIC_OR_DEPARTMENT_OTHER)
Admission: EM | Admit: 2018-01-27 | Discharge: 2018-01-27 | Disposition: A | Discharge status: Home / Self Care | Payer: Self-pay | Attending: Emergency Medicine | Admitting: Emergency Medicine

## 2018-01-27 ENCOUNTER — Other Ambulatory Visit: Payer: Self-pay

## 2018-01-27 ENCOUNTER — Emergency Department (HOSPITAL_BASED_OUTPATIENT_CLINIC_OR_DEPARTMENT_OTHER): Payer: Self-pay

## 2018-01-27 ENCOUNTER — Encounter (HOSPITAL_BASED_OUTPATIENT_CLINIC_OR_DEPARTMENT_OTHER): Payer: Self-pay | Admitting: *Deleted

## 2018-01-27 DIAGNOSIS — Z79899 Other long term (current) drug therapy: Secondary | ICD-10-CM | POA: Insufficient documentation

## 2018-01-27 DIAGNOSIS — Z87891 Personal history of nicotine dependence: Secondary | ICD-10-CM | POA: Insufficient documentation

## 2018-01-27 DIAGNOSIS — M25551 Pain in right hip: Secondary | ICD-10-CM | POA: Insufficient documentation

## 2018-01-27 DIAGNOSIS — M25519 Pain in unspecified shoulder: Secondary | ICD-10-CM | POA: Insufficient documentation

## 2018-01-27 DIAGNOSIS — Y999 Unspecified external cause status: Secondary | ICD-10-CM | POA: Insufficient documentation

## 2018-01-27 DIAGNOSIS — Y9389 Activity, other specified: Secondary | ICD-10-CM | POA: Insufficient documentation

## 2018-01-27 DIAGNOSIS — M542 Cervicalgia: Secondary | ICD-10-CM | POA: Insufficient documentation

## 2018-01-27 DIAGNOSIS — M545 Low back pain: Secondary | ICD-10-CM | POA: Insufficient documentation

## 2018-01-27 DIAGNOSIS — Y92410 Unspecified street and highway as the place of occurrence of the external cause: Secondary | ICD-10-CM | POA: Insufficient documentation

## 2018-01-27 LAB — PREGNANCY, URINE: PREG TEST UR: NEGATIVE

## 2018-01-27 MED ORDER — CYCLOBENZAPRINE HCL 5 MG PO TABS: 5.0000 mg | ORAL_TABLET | Freq: Two times a day (BID) | ORAL | 0 refills | Status: AC | PRN

## 2018-01-27 MED ORDER — NAPROXEN 500 MG PO TABS: 500.0000 mg | ORAL_TABLET | Freq: Two times a day (BID) | ORAL | 0 refills | Status: AC

## 2018-01-27 MED ORDER — KETOROLAC TROMETHAMINE 30 MG/ML IJ SOLN
30.0000 mg | Freq: Once | INTRAMUSCULAR | Status: AC
  Administered 2018-01-27: 30 mg via INTRAMUSCULAR
  Filled 2018-01-27: qty 1

## 2018-01-27 NOTE — ED Notes (Signed)
Pt given d/c instructions as per chart. Rx x 2. Verbalizes understanding. No questions. 
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
## 2018-01-27 NOTE — ED Notes (Signed)
ED Provider at bedside. 
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
## 2022-11-02 ENCOUNTER — Telehealth: Payer: Self-pay

## 2022-11-02 NOTE — Telephone Encounter (Signed)
Telephoned patient at mobile number. Left a voice message with [facility] (scholarship) contact information.
